# Patient Record
Sex: Female | Born: 1980 | Race: White | Hispanic: No | Marital: Single | Smoking: Former smoker
Health system: Southern US, Community
[De-identification: ages and names within clinical notes are randomized; demographics above are authoritative.]

## PROBLEM LIST (undated history)

## (undated) ENCOUNTER — Ambulatory Visit: Admission: EM

## (undated) DIAGNOSIS — I10 Essential (primary) hypertension: Secondary | ICD-10-CM

## (undated) DIAGNOSIS — F419 Anxiety disorder, unspecified: Secondary | ICD-10-CM

## (undated) DIAGNOSIS — G43909 Migraine, unspecified, not intractable, without status migrainosus: Secondary | ICD-10-CM

## (undated) DIAGNOSIS — F32A Depression, unspecified: Secondary | ICD-10-CM

## (undated) HISTORY — PX: FRACTURE SURGERY: SHX138

---
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
## 2006-11-04 ENCOUNTER — Emergency Department: Payer: Self-pay | Admitting: Internal Medicine

## 2006-12-29 ENCOUNTER — Ambulatory Visit: Payer: Self-pay | Admitting: Podiatry

## 2007-01-12 ENCOUNTER — Ambulatory Visit: Payer: Self-pay | Admitting: Podiatry

## 2007-01-17 ENCOUNTER — Ambulatory Visit: Payer: Self-pay | Admitting: Podiatry

## 2007-06-27 ENCOUNTER — Emergency Department: Payer: Self-pay | Admitting: Emergency Medicine

## 2008-04-07 ENCOUNTER — Emergency Department: Payer: Self-pay

## 2008-07-04 ENCOUNTER — Emergency Department: Payer: Self-pay | Admitting: Unknown Physician Specialty

## 2008-10-29 ENCOUNTER — Ambulatory Visit: Payer: Self-pay | Admitting: Neurology

## 2008-10-30 ENCOUNTER — Observation Stay: Payer: Self-pay | Admitting: Obstetrics & Gynecology

## 2008-11-01 ENCOUNTER — Observation Stay: Payer: Self-pay

## 2008-11-27 ENCOUNTER — Inpatient Hospital Stay: Payer: Self-pay | Admitting: Obstetrics & Gynecology

## 2010-11-12 ENCOUNTER — Emergency Department: Payer: Self-pay | Admitting: Emergency Medicine

## 2011-07-04 ENCOUNTER — Emergency Department: Payer: Self-pay | Admitting: *Deleted

## 2012-01-28 LAB — URINALYSIS, COMPLETE
Bilirubin,UR: NEGATIVE
Glucose,UR: NEGATIVE mg/dL (ref 0–75)
Leukocyte Esterase: NEGATIVE
Nitrite: NEGATIVE
Ph: 7 (ref 4.5–8.0)
Protein: NEGATIVE
RBC,UR: NONE SEEN /HPF (ref 0–5)
Specific Gravity: 1.004 (ref 1.003–1.030)
Squamous Epithelial: <1
WBC UR: <1 /HPF (ref 0–5)

## 2012-01-28 LAB — COMPREHENSIVE METABOLIC PANEL
Albumin: 4.5 g/dL (ref 3.4–5.0)
Alkaline Phosphatase: 76 U/L (ref 50–136)
Anion Gap: 9 (ref 7–16)
BUN: 9 mg/dL (ref 7–18)
Bilirubin,Total: 0.3 mg/dL (ref 0.2–1.0)
Calcium, Total: 9.3 mg/dL (ref 8.5–10.1)
Chloride: 107 mmol/L (ref 98–107)
Co2: 24 mmol/L (ref 21–32)
Creatinine: 0.81 mg/dL (ref 0.60–1.30)
EGFR (African American): >60
EGFR (Non-African Amer.): >60
Glucose: 85 mg/dL (ref 65–99)
Osmolality: 277 (ref 275–301)
Potassium: 3.8 mmol/L (ref 3.5–5.1)
SGOT(AST): 36 U/L (ref 15–37)
SGPT (ALT): 35 U/L
Sodium: 140 mmol/L (ref 136–145)
Total Protein: 8.3 g/dL — ABNORMAL HIGH (ref 6.4–8.2)

## 2012-01-28 LAB — DRUG SCREEN, URINE

## 2012-01-28 LAB — ACETAMINOPHEN LEVEL: Acetaminophen: <2 ug/mL

## 2012-01-28 LAB — CBC
HCT: 43.9 % (ref 35.0–47.0)
HGB: 15.3 g/dL (ref 12.0–16.0)
MCH: 31.9 pg (ref 26.0–34.0)
MCHC: 34.8 g/dL (ref 32.0–36.0)
MCV: 92 fL (ref 80–100)
Platelet: 238 10*3/uL (ref 150–440)
RBC: 4.79 10*6/uL (ref 3.80–5.20)
RDW: 13.4 % (ref 11.5–14.5)
WBC: 10.3 10*3/uL (ref 3.6–11.0)

## 2012-01-28 LAB — SALICYLATE LEVEL: Salicylates, Serum: 4.3 mg/dL — ABNORMAL HIGH

## 2012-01-28 LAB — ETHANOL
Ethanol %: <0.003 % (ref 0.000–0.080)
Ethanol: <3 mg/dL

## 2012-01-28 LAB — TSH: Thyroid Stimulating Horm: 1.28 u[IU]/mL

## 2012-01-28 LAB — PREGNANCY, URINE: Pregnancy Test, Urine: NEGATIVE m[IU]/mL

## 2012-01-29 ENCOUNTER — Inpatient Hospital Stay: Payer: Self-pay | Admitting: Psychiatry

## 2012-04-20 ENCOUNTER — Emergency Department: Payer: Self-pay | Admitting: Unknown Physician Specialty

## 2012-04-20 LAB — RAPID INFLUENZA A&B ANTIGENS

## 2012-04-22 LAB — BETA STREP CULTURE(ARMC)

## 2012-09-28 ENCOUNTER — Emergency Department: Payer: Self-pay | Admitting: Emergency Medicine

## 2013-11-19 ENCOUNTER — Encounter: Payer: Self-pay | Admitting: General Surgery

## 2013-12-07 ENCOUNTER — Emergency Department: Payer: Self-pay | Admitting: Emergency Medicine

## 2013-12-09 ENCOUNTER — Ambulatory Visit: Payer: Self-pay | Admitting: General Surgery

## 2013-12-10 ENCOUNTER — Encounter: Payer: Self-pay | Admitting: *Deleted

## 2014-02-18 ENCOUNTER — Inpatient Hospital Stay: Payer: Self-pay | Admitting: Obstetrics & Gynecology

## 2014-02-18 LAB — CBC WITH DIFFERENTIAL/PLATELET
BASOS PCT: 0 %
Basophil #: 0 10*3/uL (ref 0.0–0.1)
Basophil #: 0 10*3/uL (ref 0.0–0.1)
Basophil #: 0 10*3/uL (ref 0.0–0.1)
Basophil #: 0 10*3/uL (ref 0.0–0.1)
Basophil %: 0.1 %
Basophil %: 0 %
Basophil %: 0.1 %
EOS ABS: 0.1 10*3/uL (ref 0.0–0.7)
EOS ABS: 0 10*3/uL (ref 0.0–0.7)
EOS PCT: 0.6 %
EOS PCT: 0.1 %
Eosinophil #: 0 10*3/uL (ref 0.0–0.7)
Eosinophil #: 0 10*3/uL (ref 0.0–0.7)
Eosinophil %: 0.1 %
Eosinophil %: 0 %
HCT: 29.3 % — ABNORMAL LOW (ref 35.0–47.0)
HCT: 22.3 % — AB (ref 35.0–47.0)
HCT: 20.5 % — ABNORMAL LOW (ref 35.0–47.0)
HCT: 24.8 % — AB (ref 35.0–47.0)
HGB: 9.8 g/dL — AB (ref 12.0–16.0)
HGB: 7.2 g/dL — AB (ref 12.0–16.0)
HGB: 6.8 g/dL — AB (ref 12.0–16.0)
HGB: 8.5 g/dL — ABNORMAL LOW (ref 12.0–16.0)
LYMPHS PCT: 8.1 %
Lymphocyte #: 2.9 10*3/uL (ref 1.0–3.6)
Lymphocyte #: 1.1 10*3/uL (ref 1.0–3.6)
Lymphocyte #: 1 10*3/uL (ref 1.0–3.6)
Lymphocyte #: 1.8 10*3/uL (ref 1.0–3.6)
Lymphocyte %: 26.1 %
Lymphocyte %: 7.8 %
Lymphocyte %: 16.9 %
MCH: 30.9 pg (ref 26.0–34.0)
MCH: 29.4 pg (ref 26.0–34.0)
MCH: 30 pg (ref 26.0–34.0)
MCH: 30.6 pg (ref 26.0–34.0)
MCHC: 33.6 g/dL (ref 32.0–36.0)
MCHC: 32.1 g/dL (ref 32.0–36.0)
MCHC: 33.3 g/dL (ref 32.0–36.0)
MCHC: 34.5 g/dL (ref 32.0–36.0)
MCV: 92 fL (ref 80–100)
MCV: 92 fL (ref 80–100)
MCV: 90 fL (ref 80–100)
MCV: 89 fL (ref 80–100)
MONO ABS: 0.5 x10 3/mm (ref 0.2–0.9)
MONO ABS: 0.6 x10 3/mm (ref 0.2–0.9)
MONOS PCT: 3.8 %
Monocyte #: 0.6 x10 3/mm (ref 0.2–0.9)
Monocyte #: 0.8 x10 3/mm (ref 0.2–0.9)
Monocyte %: 5.1 %
Monocyte %: 5.3 %
Monocyte %: 5.4 %
NEUTROS ABS: 7.5 10*3/uL — AB (ref 1.4–6.5)
NEUTROS PCT: 88.1 %
Neutrophil #: 12.6 10*3/uL — ABNORMAL HIGH (ref 1.4–6.5)
Neutrophil #: 11.3 10*3/uL — ABNORMAL HIGH (ref 1.4–6.5)
Neutrophil #: 8.4 10*3/uL — ABNORMAL HIGH (ref 1.4–6.5)
Neutrophil %: 68.1 %
Neutrophil %: 86.8 %
Neutrophil %: 77.5 %
PLATELETS: 118 10*3/uL — AB (ref 150–440)
Platelet: 134 10*3/uL — ABNORMAL LOW (ref 150–440)
Platelet: 121 10*3/uL — ABNORMAL LOW (ref 150–440)
Platelet: 119 10*3/uL — ABNORMAL LOW (ref 150–440)
RBC: 3.18 10*6/uL — ABNORMAL LOW (ref 3.80–5.20)
RBC: 2.43 10*6/uL — ABNORMAL LOW (ref 3.80–5.20)
RBC: 2.27 10*6/uL — ABNORMAL LOW (ref 3.80–5.20)
RBC: 2.79 10*6/uL — ABNORMAL LOW (ref 3.80–5.20)
RDW: 14.1 % (ref 11.5–14.5)
RDW: 13.9 % (ref 11.5–14.5)
RDW: 13.9 % (ref 11.5–14.5)
RDW: 14 % (ref 11.5–14.5)
WBC: 11 10*3/uL (ref 3.6–11.0)
WBC: 14.5 10*3/uL — ABNORMAL HIGH (ref 3.6–11.0)
WBC: 12.8 10*3/uL — ABNORMAL HIGH (ref 3.6–11.0)
WBC: 10.8 10*3/uL (ref 3.6–11.0)

## 2014-02-18 LAB — COMPREHENSIVE METABOLIC PANEL
ALT: 24 U/L
Albumin: 2.3 g/dL — ABNORMAL LOW (ref 3.4–5.0)
Alkaline Phosphatase: 98 U/L
Anion Gap: 8 (ref 7–16)
BILIRUBIN TOTAL: 0.2 mg/dL (ref 0.2–1.0)
BUN: 6 mg/dL — ABNORMAL LOW (ref 7–18)
CALCIUM: 8.2 mg/dL — AB (ref 8.5–10.1)
CO2: 19 mmol/L — AB (ref 21–32)
CREATININE: 0.82 mg/dL (ref 0.60–1.30)
Chloride: 111 mmol/L — ABNORMAL HIGH (ref 98–107)
EGFR (African American): >60
EGFR (Non-African Amer.): >60
Glucose: 115 mg/dL — ABNORMAL HIGH (ref 65–99)
Osmolality: 274 (ref 275–301)
Potassium: 3.7 mmol/L (ref 3.5–5.1)
SGOT(AST): 22 U/L (ref 15–37)
SODIUM: 138 mmol/L (ref 136–145)
Total Protein: 5.9 g/dL — ABNORMAL LOW (ref 6.4–8.2)

## 2014-02-18 LAB — URINALYSIS, COMPLETE
BACTERIA: NONE SEEN
BILIRUBIN, UR: NEGATIVE
Blood: NEGATIVE
GLUCOSE, UR: NEGATIVE mg/dL (ref 0–75)
Hyaline Cast: 2
Ketone: NEGATIVE
LEUKOCYTE ESTERASE: NEGATIVE
NITRITE: NEGATIVE
Ph: 7 (ref 4.5–8.0)
Protein: 100
RBC,UR: NONE SEEN /HPF (ref 0–5)
SPECIFIC GRAVITY: 1.011 (ref 1.003–1.030)
Squamous Epithelial: 1
WBC UR: 3 /HPF (ref 0–5)

## 2014-02-18 LAB — PROTIME-INR
INR: 1
INR: 1.2
INR: 1
PROTHROMBIN TIME: 13.1 s (ref 11.5–14.7)
Prothrombin Time: 15 secs — ABNORMAL HIGH (ref 11.5–14.7)
Prothrombin Time: 13.2 secs (ref 11.5–14.7)

## 2014-02-18 LAB — APTT
Activated PTT: 27.8 secs (ref 23.6–35.9)
Activated PTT: 31 secs (ref 23.6–35.9)
Activated PTT: 30.6 secs (ref 23.6–35.9)
Activated PTT: 29.7 secs (ref 23.6–35.9)

## 2014-02-18 LAB — FIBRINOGEN
FIBRINOGEN: 170 mg/dL — AB (ref 210–470)
FIBRINOGEN: 175 mg/dL — AB (ref 210–470)
Fibrinogen: 236 mg/dL (ref 210–470)

## 2014-02-18 LAB — DRUG SCREEN, URINE
AMPHETAMINES, UR SCREEN: NEGATIVE (ref ?–1000)
BARBITURATES, UR SCREEN: NEGATIVE (ref ?–200)
Benzodiazepine, Ur Scrn: NEGATIVE (ref ?–200)
Cannabinoid 50 Ng, Ur ~~LOC~~: NEGATIVE (ref ?–50)
Cocaine Metabolite,Ur ~~LOC~~: NEGATIVE (ref ?–300)
MDMA (Ecstasy)Ur Screen: NEGATIVE (ref ?–500)
Methadone, Ur Screen: NEGATIVE (ref ?–300)
Opiate, Ur Screen: POSITIVE (ref ?–300)
Phencyclidine (PCP) Ur S: NEGATIVE (ref ?–25)
Tricyclic, Ur Screen: NEGATIVE (ref ?–1000)

## 2014-02-19 LAB — CBC WITH DIFFERENTIAL/PLATELET
BASOS ABS: 0 10*3/uL (ref 0.0–0.1)
Basophil #: 0 10*3/uL (ref 0.0–0.1)
Basophil %: 0.1 %
Basophil %: 0.1 %
Eosinophil #: 0 10*3/uL (ref 0.0–0.7)
Eosinophil #: 0 10*3/uL (ref 0.0–0.7)
Eosinophil %: 0 %
Eosinophil %: 0.4 %
HCT: 20.9 % — ABNORMAL LOW (ref 35.0–47.0)
HCT: 22.1 % — ABNORMAL LOW (ref 35.0–47.0)
HGB: 7 g/dL — AB (ref 12.0–16.0)
HGB: 7.6 g/dL — ABNORMAL LOW (ref 12.0–16.0)
LYMPHS ABS: 2 10*3/uL (ref 1.0–3.6)
Lymphocyte #: 0.9 10*3/uL — ABNORMAL LOW (ref 1.0–3.6)
Lymphocyte %: 6.9 %
Lymphocyte %: 17.6 %
MCH: 29.5 pg (ref 26.0–34.0)
MCH: 30.2 pg (ref 26.0–34.0)
MCHC: 33.5 g/dL (ref 32.0–36.0)
MCHC: 34.6 g/dL (ref 32.0–36.0)
MCV: 88 fL (ref 80–100)
MCV: 87 fL (ref 80–100)
MONOS PCT: 6.5 %
Monocyte #: 0.5 x10 3/mm (ref 0.2–0.9)
Monocyte #: 0.7 x10 3/mm (ref 0.2–0.9)
Monocyte %: 4.3 %
NEUTROS ABS: 8.4 10*3/uL — AB (ref 1.4–6.5)
NEUTROS PCT: 88.7 %
NEUTROS PCT: 75.4 %
Neutrophil #: 11.2 10*3/uL — ABNORMAL HIGH (ref 1.4–6.5)
Platelet: 110 10*3/uL — ABNORMAL LOW (ref 150–440)
Platelet: 108 10*3/uL — ABNORMAL LOW (ref 150–440)
RBC: 2.38 10*6/uL — AB (ref 3.80–5.20)
RBC: 2.52 10*6/uL — AB (ref 3.80–5.20)
RDW: 14 % (ref 11.5–14.5)
RDW: 14.5 % (ref 11.5–14.5)
WBC: 12.7 10*3/uL — ABNORMAL HIGH (ref 3.6–11.0)
WBC: 11.1 10*3/uL — AB (ref 3.6–11.0)

## 2014-02-20 LAB — HEMATOCRIT: HCT: 20.5 % — AB (ref 35.0–47.0)

## 2014-02-21 LAB — PATHOLOGY REPORT

## 2014-09-05 ENCOUNTER — Ambulatory Visit: Payer: Self-pay | Admitting: Registered Nurse

## 2014-09-25 ENCOUNTER — Ambulatory Visit: Payer: Self-pay | Admitting: Emergency Medicine

## 2014-09-25 LAB — RAPID STREP-A WITH REFLX: Micro Text Report: NEGATIVE

## 2014-09-27 ENCOUNTER — Ambulatory Visit: Payer: Self-pay | Admitting: Physician Assistant

## 2014-09-28 LAB — BETA STREP CULTURE(ARMC)
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
## 2014-10-21 NOTE — Discharge Summary (Signed)
PATIENT NAME:  [name], [name] DATE OF BIRTH:  September 08, 1980  DATE OF ADMISSION:  01/29/2012 DATE OF DISCHARGE:  01/30/2012  HOSPITAL COURSE: See dictated history and physical for details of admission. This 34 year old woman with no previous psychiatric hospitalization was brought to the Emergency Room by EMS after she impulsively tried to overdose at home. Since being in the hospital the patient has denied any suicidal ideation. She has described how she has long-term anxiety and irritability especially with regard to her husband. She and her husband were in an argument when she impulsively went to swallow some pills right in front of him. She says at that time she was sure that he was going to stop her which he did. She admits that she probably has some problems with mood swings at times. She was agreeable to starting medication for depression and anxiety. First day here she got venlafaxine 75 mg a day which I subsequently changed to Prozac. She tells me now that she prefers the way that the venlafaxine felt and would like to continue with that. I have changed her back to venlafaxine extended-release 75 mg a day. Patient has been involved in groups and individual therapy and educated about the importance of getting therapy and staying on medication hopefully to try to decrease long-term risk of dangerousness and help her with her anxiety and depression. She is agreeable to a follow up plan. At this point her husband is agreeable to her coming back home. She appears to be at baseline. She will be discharged back home with [facility].   LABORATORY, DIAGNOSTIC AND RADIOLOGICAL DATA: Admission labs showed a pregnancy test that was negative. Urinalysis unremarkable. Drug screen negative. TSH normal. CBC normal. Alcohol undetectable. Chemistry show slightly elevated total protein of 8.3 of no significance.   DISCHARGE MEDICATIONS:  1. Venlafaxine extended-release 75 mg one per  day. 2. Trazodone 50 mg at night.   MENTAL STATUS EXAM:: Neatly dressed and groomed woman who looks her stated age. Good eye contact. Normal psychomotor activity. Appropriate interaction style. Alert and oriented x4. Short-term memory and longer term memory intact. Appears to be of normal intelligence. Affect reactive, euthymic and appropriate. Mood stated as being good. Thoughts are lucid with no evidence of loosening of associations or delusions. Denied auditory or visual hallucinations. Denied suicidal or homicidal ideation. Judgment and insight improved.   DISPOSITION: Discharged home. [facility].   DIAGNOSIS PRINCIPLE AND PRIMARY:  AXIS I: Anxiety disorder, not otherwise specified.   SECONDARY DIAGNOSES:  AXIS I: No further.   AXIS II: Deferred.   AXIS III: No diagnosis.   AXIS IV: Moderate from stress in her relationship with her husband.   AXIS V: Functioning at time of discharge 60.   ____________________________ Audery AmelJohn T. Zyana Amaro, MD jtc:cms D: 01/30/2012 11:51:08 ET T: 01/31/2012 10:22:14 ET JOB#: [id number]  cc: Audery AmelJohn T. Aryan Sparks, MD, <Dictator> Audery AmelJOHN T Lovey Crupi MD ELECTRONICALLY SIGNED 01/31/2012 11:16
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
## 2014-11-11 NOTE — H&P (Signed)
L&D Evaluation:  History Expanded:  HPI 34yo G2P1 at 14 weeks w estimated date of confinement 03/03/14, who presents to ER after syncopal event at work with fall.  She remembers waking up, and witnesses said they heard a loud noise and came to her aid immediately, finding her face down on ground.  No known trauma from fall to head or long bones.  Found to be Hypotensive by EMS and ER initially.  Fetal doppler found no FHTs, and Ultrasound was done in ER with also no FHTs.  Confirmed by CNM.  Confirmed by self.  Patient denies vag bleeding or contractions or ROM.    Patient reports h/o syncope since age 57, with several events over the years, never found to have any other specific etiology, according to the patient and her mother.  No h/o seizure d/o.  Patient has not had an episode this pregnancy before today.  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  Prenatal Care at [facility] by obesity and prior Cesarean Section, planning on Cesarean Section and Bilateral Tubal Ligation on Aug 27.  No HTN, GDM, Preterm Labor, infections, trauma, or other prenatal concerns.  O+, RNI, VI, GBS-, TDaP UTD.   Gravida 2   Term 1   Blood Type (Maternal) O positive   Group B Strep Results Maternal (Result >5wks must be treated as unknown) negative   Maternal Varicella Immune   Rubella Results (Maternal) nonimmune   Maternal T-Dap Immune   [facility] 03-Mar-2014   Patient's Medical History Obesity   Patient's Surgical History Previous C-Section   Medications Pre Natal Vitamins   Allergies NKDA   Social History none   Family History Non-Contributory   ROS:  ROS All systems were reviewed.  HEENT, CNS, GI, GU, Respiratory, CV, Renal and Musculoskeletal systems were found to be normal.   Exam:  Vital Signs BP 80/40, up to 100/60, P90   General no apparent distress   Mental Status clear   Chest clear   Heart normal sinus rhythm, no murmur/gallop/rubs   Abdomen gravid,  non-tender, Vtx   Estimated Fetal Weight Average for gestational age   Back no CVAT   Edema no edema   FHT No FHTs   Skin dry   Impression:  Impression Fetal demise, related to syncope and/or trauma.  May have abruption.   Plan:  Plan Fetal demise discused w patient.   Comments 1. Evaluation as to why demise occured.  Most likely she had syncope w fall, and during time of hypotension there was poor circulation to fetus.  There also could be placental dysfunction such as abruption, before the fall that led to the hypotension, or after the fall from the trauma.  Other causes can be considered, but lower liklihood and with this more clear event, it seems to be related to syncope/fall. 2. Ultrasound by [facility], visisualized with Midwife Danise Mina, and then repeated briefly by self for confirmation, both confirm abscence of fetal cardiac activity.  Unable to clearly tell abruption from these scans.  FLuid levels appear low.  Vertex.  No FM. 3. Induction of labor discussed w patient, even as she is prior Cesarean Section.  Most benefit of Cesarean Section is for fetal safety, and so little benefit for Cesarean Section at this time.  Monitor labor induction for any signs/risks of uterine rupture.  Would prefer Pitocin to prostaglandins for this reason.  Will prepare opt for labor, with any analgesia requried, once clear from ER from hypotension  and trauma (head injury?) standpoint. 4. O+ 5. Needs MMR pp. 6. Chaplain avaiable.  Counseling as needed. Sorrow expressed to family.   Electronic Signatures: Hoyt Koch (MD)  (Signed 18-Aug-15 06:16)  Authored: L&D Evaluation   Last Updated: 18-Aug-15 06:16 by Hoyt Koch (MD)

## 2015-08-26 ENCOUNTER — Ambulatory Visit
Admission: EM | Admit: 2015-08-26 | Discharge: 2015-08-26 | Disposition: A | Discharge status: Other Acute Inpatient Hospital | Payer: 59 | Attending: Family Medicine | Admitting: Family Medicine

## 2015-08-26 ENCOUNTER — Encounter: Payer: Self-pay | Admitting: *Deleted

## 2015-08-26 DIAGNOSIS — R1011 Right upper quadrant pain: Secondary | ICD-10-CM

## 2015-08-26 DIAGNOSIS — R197 Diarrhea, unspecified: Secondary | ICD-10-CM

## 2015-08-26 DIAGNOSIS — R1110 Vomiting, unspecified: Secondary | ICD-10-CM

## 2015-08-26 LAB — RAPID INFLUENZA A&B ANTIGENS
Influenza A (ARMC): NOT DETECTED
Influenza B (ARMC): NOT DETECTED

## 2015-08-26 LAB — OCCULT BLOOD X 1 CARD TO LAB, STOOL: FECAL OCCULT BLD: NEGATIVE

## 2015-08-26 MED ORDER — ONDANSETRON 4 MG PO TBDP
4.0000 mg | ORAL_TABLET | Freq: Once | ORAL | Status: AC
  Administered 2015-08-26: 4 mg via ORAL

## 2015-08-26 NOTE — ED Notes (Signed)
Cough, fever, epigastric pain, diarrhea, dark stools since yesterday.

## 2015-08-26 NOTE — ED Provider Notes (Signed)
[facility]  ____________________________________________  Time seen: Approximately 6:36 PM  I have reviewed the triage vital signs and the nursing notes.   HISTORY  Chief Complaint Cough; Fever; Abdominal Pain; and Diarrhea   HPI [name] is a 35 y.o. female presents with a complaint of one day history of nausea, vomiting, diarrhea and abdominal pain. Patient reports that at work last night she had a 102.6 orally fever. Reports decreased appetite. Reports continues to drink fluids. Denies current nausea. Patient reports that she has abdominal pain described as 7 out of 10 to abdomen generalized and described as a "twisting pain ", and reports that the abdominal pain worsens after food as well as prior to needing to vomit or diarrhea. States this time the abdominal pain is mild. Patient reports that she has numerous diarrhea episodes today but only with a few episodes of vomiting. Patient reports that prior to arrival she had episode of diarrhea in which her stool was very dark colored. Denies blood in stool, blood in toilet or blood when wiping. Denies hemorrhoids. Denies history of rectal bleeding. States current abdominal pain is mild.  Denies cough, congestion, runny nose, sore throat, headache. Denies chest pain, shortness of breath, dizziness, weakness, neck or back pain. Denies rash. Denies dysuria or vaginal complaints.  Last menstrual: 2 weeks ago. Denies chance of pregnancy.   PCP: [facility]   History reviewed. No pertinent past medical history.  There are no active problems to display for this patient.   Past Surgical History  Procedure Laterality Date  . Cesarean section      No current outpatient prescriptions on file.  Allergies Review of patient's allergies indicates no known allergies.  History reviewed. No pertinent family history.  Social History Social History  Substance Use Topics  . Smoking status: Current Every Day Smoker -- 0.50  packs/day    Types: Cigarettes  . Smokeless tobacco: None  . Alcohol Use: No    Review of Systems Constitutional: Positive report of fever.  Eyes: No visual changes. ENT: No sore throat. Cardiovascular: Denies chest pain. Respiratory: Denies shortness of breath. Gastrointestinal:Positive abdominal pain, nausea, vomiting and diarrhea. Reports dark stools.  Genitourinary: Negative for dysuria. Musculoskeletal: Negative for back pain. Skin: Negative for rash. Neurological: Negative for headaches, focal weakness or numbness.  10-point ROS otherwise negative.  ____________________________________________   PHYSICAL EXAM:  VITAL SIGNS: ED Triage Vitals  Enc Vitals Group     BP 08/26/15 1738 117/72 mmHg     Pulse Rate 08/26/15 1738 96     Resp 08/26/15 1738 16     Temp 08/26/15 1738 98.9 F (37.2 C)     Temp src --  oral     SpO2 08/26/15 1738 97 %     Weight 08/26/15 1738 234 lb (106.142 kg)     Height 08/26/15 1738  (1.6 m)     Head Cir --      Peak Flow --      Pain Score 08/26/15 1743 7     Pain Loc --      Pain Edu? --      Excl. in GC? --     Constitutional: Alert and oriented. Well appearing and in no acute distress. Eyes: Conjunctivae are normal. PERRL. EOMI. Head: Atraumatic.Nontender. No swelling. No erythema.  Ears: no erythema, normal TMs bilaterally.   Nose: No congestion/rhinnorhea.  Mouth/Throat: Mucous membranes are moist.  Oropharynx non-erythematous. Neck: No stridor.  No cervical spine tenderness to palpation.  Hematological/Lymphatic/Immunilogical: No cervical lymphadenopathy. Cardiovascular: Normal rate, regular rhythm. Grossly normal heart sounds.  Good peripheral circulation. Respiratory: Normal respiratory effort.  No retractions. Lungs CTAB. Gastrointestinal:Mild bilateral lower quadrant tenderness, mild to moderate left upper quadrant tenderness to palpation, moderate tenderness to palpation epigastric and right upper quadrant with  guarding right upper quadrant . Obese abdomen  Normal Bowel sounds. No CVA tenderness. Rectal exam : Completed with RN at bedside.  No external hemorrhoids, mass or lesions. Rectal exam nontender. Stool slightly dark in color. No gross blood present. Hemoccult test obtained.  Musculoskeletal: No lower or upper extremity tenderness nor edema. No cervical, thoracic or lumbar tenderness to palpation.  Neurologic:  Normal speech and language. No gross focal neurologic deficits are appreciated. No gait instability. Skin:  Skin is warm, dry and intact. No rash noted. Psychiatric: Mood and affect are normal. Speech and behavior are normal.  ____________________________________________   LABS (all labs ordered are listed, but only abnormal results are displayed)  Labs Reviewed  RAPID INFLUENZA A&B ANTIGENS ([facility] ONLY)  OCCULT BLOOD X 1 CARD TO LAB, STOOL    INITIAL IMPRESSION / ASSESSMENT AND PLAN / ED COURSE  Pertinent labs & imaging results that were available during my care of the patient were reviewed by me and considered in my medical decision making (see chart for details).  Overall well-appearing patient. Guarding right abdomen. Presents for the complaints of nausea, vomiting, diarrhea since yesterday with accompanying fever. Reports fever of 102.6 last night. Reports tolerating some fluids but no foods today. Also reports dark stools today. Generalized diffuse abdominal tenderness with moderate tenderness right upper quadrant. Zofran 4 mg ODT 1 in urgent care.  Patient also reports that she has had GI viruses in the past and has never had abdominal pain to this degree. Patient states that she is concerned due to amount of abdominal pain that she is having. Influenza negative. Hemoccult negative. Suspect dark colored stool may be secondary to the Pepto-Bismol that she took home. From subjective report suspect viral nausea, vomiting, diarrhea however due to amount of pain concern for other  etiology.   Discussed in detail with patient evaluating by laboratory studies in urgent care. However due to concern that patient may need ultrasound right upper quadrant of abdomen for further evaluation of right upper quadrant abdominal pain and recommend patient to be further evaluated in emergency room of her choice.. After discussing with patient patient states that she does not want lab studies done in urgent care at this time. Patient states that she prefers for laboratory studies to be completed at emergency room. Patient states that she will go to [facility] emergency room. Patient states that she will drive herself and does not want to go by EMS. Patient verbalized understanding of risks of driving herself even up to death, and states will drive herself.  Kim RN to call [facility] to give report. Patient stable at the time of transfer.  Discussed follow up with Primary care physician this week. Discussed follow up and return parameters including no resolution or any worsening concerns. Patient verbalized understanding and agreed to plan.   ____________________________________________   FINAL CLINICAL IMPRESSION(S) / ED DIAGNOSES  Final diagnoses:  Right upper quadrant pain  Vomiting and diarrhea      Note: This dictation was prepared with Dragon dictation along with smaller phrase technology. Any transcriptional errors that result from this process are unintentional.    Renford Dills, NP 08/26/15 2020

## 2015-08-26 NOTE — Discharge Instructions (Signed)
As discussed go directly to the emergency room  Follow-up closely with your primary care physician this week. Return to urgent care as needed.  Abdominal Pain, Adult Many things can cause abdominal pain. Usually, abdominal pain is not caused by a disease and will improve without treatment. It can often be observed and treated at home. Your health care provider will do a physical exam and possibly order blood tests and X-rays to help determine the seriousness of your pain. However, in many cases, more time must pass before a clear cause of the pain can be found. Before that point, your health care provider may not know if you need more testing or further treatment. HOME CARE INSTRUCTIONS Monitor your abdominal pain for any changes. The following actions may help to alleviate any discomfort you are experiencing:  Only take over-the-counter or prescription medicines as directed by your health care provider.  Do not take laxatives unless directed to do so by your health care provider.  Try a clear liquid diet (broth, tea, or water) as directed by your health care provider. Slowly move to a bland diet as tolerated. SEEK MEDICAL CARE IF:  You have unexplained abdominal pain.  You have abdominal pain associated with nausea or diarrhea.  You have pain when you urinate or have a bowel movement.  You experience abdominal pain that wakes you in the night.  You have abdominal pain that is worsened or improved by eating food.  You have abdominal pain that is worsened with eating fatty foods.  You have a fever. SEEK IMMEDIATE MEDICAL CARE IF:  Your pain does not go away within 2 hours.  You keep throwing up (vomiting).  Your pain is felt only in portions of the abdomen, such as the right side or the left lower portion of the abdomen.  You pass bloody or black tarry stools. MAKE SURE YOU:  Understand these instructions.  Will watch your condition.  Will get help right away if you are not  doing well or get worse.   This information is not intended to replace advice given to you by your health care provider. Make sure you discuss any questions you have with your health care provider.   Document Released: 03/30/2005 Document Revised: 03/11/2015 Document Reviewed: 02/27/2013 Elsevier Interactive Patient Education 2016 Elsevier Inc[employer]  Nausea and Vomiting Nausea is a sick feeling that often comes before throwing up (vomiting). Vomiting is a reflex where stomach contents come out of your mouth. Vomiting can cause severe loss of body fluids (dehydration). Children and elderly adults can become dehydrated quickly, especially if they also have diarrhea. Nausea and vomiting are symptoms of a condition or disease. It is important to find the cause of your symptoms. CAUSES   Direct irritation of the stomach lining. This irritation can result from increased acid production (gastroesophageal reflux disease), infection, food poisoning, taking certain medicines (such as nonsteroidal anti-inflammatory drugs), alcohol use, or tobacco use.  Signals from the brain.These signals could be caused by a headache, heat exposure, an inner ear disturbance, increased pressure in the brain from injury, infection, a tumor, or a concussion, pain, emotional stimulus, or metabolic problems.  An obstruction in the gastrointestinal tract (bowel obstruction).  Illnesses such as diabetes, hepatitis, gallbladder problems, appendicitis, kidney problems, cancer, sepsis, atypical symptoms of a heart attack, or eating disorders.  Medical treatments such as chemotherapy and radiation.  Receiving medicine that makes you sleep (general anesthetic) during surgery. DIAGNOSIS Your caregiver may ask for tests to be done  if the problems do not improve after a few days. Tests may also be done if symptoms are severe or if the reason for the nausea and vomiting is not clear. Tests may include:  Urine tests.  Blood  tests.  Stool tests.  Cultures (to look for evidence of infection).  X-rays or other imaging studies. Test results can help your caregiver make decisions about treatment or the need for additional tests. TREATMENT You need to stay well hydrated. Drink frequently but in small amounts.You may wish to drink water, sports drinks, clear broth, or eat frozen ice pops or gelatin dessert to help stay hydrated.When you eat, eating slowly may help prevent nausea.There are also some antinausea medicines that may help prevent nausea. HOME CARE INSTRUCTIONS   Take all medicine as directed by your caregiver.  If you do not have an appetite, do not force yourself to eat. However, you must continue to drink fluids.  If you have an appetite, eat a normal diet unless your caregiver tells you differently.  Eat a variety of complex carbohydrates (rice, wheat, potatoes, bread), lean meats, yogurt, fruits, and vegetables.  Avoid high-fat foods because they are more difficult to digest.  Drink enough water and fluids to keep your urine clear or pale yellow.  If you are dehydrated, ask your caregiver for specific rehydration instructions. Signs of dehydration may include:  Severe thirst.  Dry lips and mouth.  Dizziness.  Dark urine.  Decreasing urine frequency and amount.  Confusion.  Rapid breathing or pulse. SEEK IMMEDIATE MEDICAL CARE IF:   You have blood or brown flecks (like coffee grounds) in your vomit.  You have black or bloody stools.  You have a severe headache or stiff neck.  You are confused.  You have severe abdominal pain.  You have chest pain or trouble breathing.  You do not urinate at least once every 8 hours.  You develop cold or clammy skin.  You continue to vomit for longer than 24 to 48 hours.  You have a fever. MAKE SURE YOU:   Understand these instructions.  Will watch your condition.  Will get help right away if you are not doing well or get  worse.   This information is not intended to replace advice given to you by your health care provider. Make sure you discuss any questions you have with your health care provider.   Document Released: 06/20/2005 Document Revised: 09/12/2011 Document Reviewed: 11/17/2010 Elsevier Interactive Patient Education 2016 Elsevier Inc.  Diarrhea Diarrhea is watery poop (stool). It can make you feel weak, tired, thirsty, or give you a dry mouth (signs of dehydration). Watery poop is a sign of another problem, most often an infection. It often lasts 2-3 days. It can last longer if it is a sign of something serious. Take care of yourself as told by your doctor. HOME CARE   Drink 1 cup (8 ounces) of fluid each time you have watery poop.  Do not drink the following fluids:  Those that contain simple sugars (fructose, glucose, galactose, lactose, sucrose, maltose).  Sports drinks.  Fruit juices.  Whole milk products.  Sodas.  Drinks with caffeine (coffee, tea, soda) or alcohol.  Oral rehydration solution may be used if the doctor says it is okay. You may make your own solution. Follow this recipe:   - teaspoon table salt.   teaspoon baking soda.   teaspoon salt substitute containing potassium chloride.  1 tablespoons sugar.  1 liter (34 ounces) of water.  Avoid the following foods:  High fiber foods, such as raw fruits and vegetables.  Nuts, seeds, and whole grain breads and cereals.   Those that are sweetened with sugar alcohols (xylitol, sorbitol, mannitol).  Try eating the following foods:  Starchy foods, such as rice, toast, pasta, low-sugar cereal, oatmeal, baked potatoes, crackers, and bagels.  Bananas.  Applesauce.  Eat probiotic-rich foods, such as yogurt and milk products that are fermented.  Wash your hands well after each time you have watery poop.  Only take medicine as told by your doctor.  Take a warm bath to help lessen burning or pain from having watery  poop. GET HELP RIGHT AWAY IF:   You cannot drink fluids without throwing up (vomiting).  You keep throwing up.  You have blood in your poop, or your poop looks black and tarry.  You do not pee (urinate) in 6-8 hours, or there is only a small amount of very dark pee.  You have belly (abdominal) pain that gets worse or stays in the same spot (localizes).  You are weak, dizzy, confused, or light-headed.  You have a very bad headache.  Your watery poop gets worse or does not get better.  You have a fever or lasting symptoms for more than 2-3 days.  You have a fever and your symptoms suddenly get worse. MAKE SURE YOU:   Understand these instructions.  Will watch your condition.  Will get help right away if you are not doing well or get worse.   This information is not intended to replace advice given to you by your health care provider. Make sure you discuss any questions you have with your health care provider.   Document Released: 12/07/2007 Document Revised: 07/11/2014 Document Reviewed: 02/26/2012 Elsevier Interactive Patient Education [employer]

## 2016-02-27 ENCOUNTER — Emergency Department: Payer: 59

## 2016-02-27 ENCOUNTER — Emergency Department
Admission: EM | Admit: 2016-02-27 | Discharge: 2016-02-27 | Disposition: A | Discharge status: Home / Self Care | Payer: 59 | Attending: Emergency Medicine | Admitting: Emergency Medicine

## 2016-02-27 DIAGNOSIS — S62316A Displaced fracture of base of fifth metacarpal bone, right hand, initial encounter for closed fracture: Secondary | ICD-10-CM | POA: Diagnosis not present

## 2016-02-27 DIAGNOSIS — Y939 Activity, unspecified: Secondary | ICD-10-CM | POA: Insufficient documentation

## 2016-02-27 DIAGNOSIS — W230XXA Caught, crushed, jammed, or pinched between moving objects, initial encounter: Secondary | ICD-10-CM | POA: Diagnosis not present

## 2016-02-27 DIAGNOSIS — S6991XA Unspecified injury of right wrist, hand and finger(s), initial encounter: Secondary | ICD-10-CM | POA: Diagnosis present

## 2016-02-27 DIAGNOSIS — Y92009 Unspecified place in unspecified non-institutional (private) residence as the place of occurrence of the external cause: Secondary | ICD-10-CM | POA: Diagnosis not present

## 2016-02-27 DIAGNOSIS — F17210 Nicotine dependence, cigarettes, uncomplicated: Secondary | ICD-10-CM | POA: Insufficient documentation

## 2016-02-27 DIAGNOSIS — S62309A Unspecified fracture of unspecified metacarpal bone, initial encounter for closed fracture: Secondary | ICD-10-CM

## 2016-02-27 DIAGNOSIS — Y999 Unspecified external cause status: Secondary | ICD-10-CM | POA: Diagnosis not present

## 2016-02-27 MED ORDER — DOCUSATE SODIUM 100 MG PO CAPS: ORAL_CAPSULE | ORAL | 0 refills | Status: DC

## 2016-02-27 MED ORDER — OXYCODONE-ACETAMINOPHEN 5-325 MG PO TABS
ORAL_TABLET | ORAL | Status: AC
  Filled 2016-02-27: qty 1

## 2016-02-27 MED ORDER — OXYCODONE-ACETAMINOPHEN 5-325 MG PO TABS: 1.0000 | ORAL_TABLET | ORAL | 0 refills | Status: DC | PRN

## 2016-02-27 MED ORDER — OXYCODONE-ACETAMINOPHEN 5-325 MG PO TABS
1.0000 | ORAL_TABLET | ORAL | Status: DC | PRN
  Administered 2016-02-27: 1 via ORAL

## 2016-02-27 MED ORDER — OXYCODONE-ACETAMINOPHEN 5-325 MG PO TABS
2.0000 | ORAL_TABLET | Freq: Once | ORAL | Status: AC
  Administered 2016-02-27: 2 via ORAL
  Filled 2016-02-27: qty 2

## 2016-02-27 NOTE — ED Triage Notes (Signed)
Pt ambulatory to triage with no difficulty. Pt reports around 730 pm she shut her right hand in her front door. Pain and swelling to hand. CMS intact.

## 2016-02-27 NOTE — Discharge Instructions (Signed)
Please read through the included information about metacarpal fractures and splint care.  You need to follow up with an orthopedic surgeon such as Dr. Martha ClanKrasinski next week.  He may be able to manage nonoperatively or you may need a minor surgical procedure to help the fracture healed.  Please return to the emergency department with new or worsening symptoms that concern you.

## 2016-02-27 NOTE — ED Notes (Signed)
Pt. States she accidentally closed door on rt. Hand.  Pt. Has swelling to 4th and 5th distal metacarpals.

## 2016-02-27 NOTE — ED Provider Notes (Signed)
[facility] Emergency Department Provider Note  ____________________________________________   First MD Initiated Contact with Patient 02/27/16 [phone]     (approximate)  I have reviewed the triage vital signs and the nursing notes.   HISTORY  Chief Complaint Hand Pain    HPI [name] is a 35 y.o. female with no significant past medical history who presents for evaluation of acute onset right hand pain after shutting her hand in the front door of her house.  She states that it was an accident and that she did it to herself.  The pain was sharp and throbbing and has been present since the accident hours ago.  She tried to go to work but was not able to bear any weight with her hand.  There is a significant amount of swelling and bruising of the hand on the ulnar side.  She has pain that is reproduced especially with extension or flexion of the pinky finger.  There is no numbness or tingling and she has normal sensation.  She did not sustain any other injuries.  She describes the pain as severe and any amount of movement makes it worse.   No past medical history on file.  There are no active problems to display for this patient.   Past Surgical History:  Procedure Laterality Date  . CESAREAN SECTION      Prior to Admission medications   Medication Sig Start Date End Date Taking? Authorizing Provider  docusate sodium (COLACE) 100 MG capsule Take 1 tablet once or twice daily as needed for constipation while taking narcotic pain medicine 02/27/16   Loleta Rose, MD  oxyCODONE-acetaminophen (ROXICET) 5-325 MG tablet Take 1-2 tablets by mouth every 4 (four) hours as needed for severe pain. 02/27/16   Loleta Rose, MD    Allergies Review of patient's allergies indicates no known allergies.  No family history on file.  Social History Social History  Substance Use Topics  . Smoking status: Current Every Day Smoker    Packs/day: 0.50    Types:  Cigarettes  . Smokeless tobacco: Not on file  . Alcohol use No    Review of Systems Cardiovascular: Denies chest pain. Respiratory: Denies shortness of breath. Gastrointestinal: No abdominal pain.  No nausea, no vomiting.  No diarrhea.  No constipation. Musculoskeletal: Severe right hand pain Skin: Negative for rash and laceration Neurological: Negative for headaches, focal weakness or numbness.   ____________________________________________   PHYSICAL EXAM:  VITAL SIGNS: ED Triage Vitals  Enc Vitals Group     BP 02/27/16 0138 129/72     Pulse Rate 02/27/16 0138 (!) 104     Resp 02/27/16 0138 18     Temp 02/27/16 0138 98.9 F (37.2 C)     Temp Source 02/27/16 0138 Oral     SpO2 02/27/16 0138 100 %     Weight 02/27/16 0139 228 lb (103.4 kg)     Height 02/27/16 0139 5\' 3"  (1.6 m)     Head Circumference --      Peak Flow --      Pain Score 02/27/16 0139 9     Pain Loc --      Pain Edu? --      Excl. in GC? --     Constitutional: Alert and oriented. Well appearing and in no acute distress. Head: Atraumatic. Cardiovascular: Normal rate, regular rhythm. Good peripheral circulation.  Respiratory: Normal respiratory effort.  No retractions.  Musculoskeletal: Mild to moderate swelling and ecchymosis  of the ulnar side of the patient's hand most notable on the dorsal aspect.  Severe tenderness to palpation and with passive flexion and extension of the pinky finger but there is no evidence of injury to the digits themselves.  No snuffbox tenderness or any other pain on the radial side of the hand. Injury is closed with no lacerations. Neurologic:  Normal speech and language. No gross focal neurologic deficits are appreciated.  Skin:  Skin is warm, dry and intact. No rash noted. Psychiatric: Mood and affect are normal. Speech and behavior are normal.  ____________________________________________   LABS (all labs ordered are listed, but only abnormal results are  displayed)  Labs Reviewed - No data to display ____________________________________________  EKG  None - EKG not ordered by ED physician ____________________________________________  RADIOLOGY Marylou Mccoy, personally viewed and evaluated these images (plain radiographs) as part of my medical decision making, as well as reviewing the written report by the radiologist.   Dg Hand Complete Right  Result Date: 02/27/2016 CLINICAL DATA:  Patient slammed right hand in the front door this morning. Bruising and swelling medially. EXAM: RIGHT HAND - COMPLETE 3+ VIEW COMPARISON:  None. FINDINGS: Acute fracture of the distal right fifth metacarpal bone with volar angulation of the distal fracture fragments. Overlying soft tissue swelling is present. No dislocation at the metacarpal phalangeal joint. Right hand appears otherwise intact. IMPRESSION: Fracture of the distal right fifth metacarpal bone with volar angulation of the distal fracture fragment Electronically Signed   By: Burman Nieves M.D.   On: 02/27/2016 02:19    ____________________________________________   PROCEDURES  Procedure(s) performed:   .Splint Application Date/Time: 02/27/2016 4:00 AM Performed by: Loleta Rose Authorized by: Loleta Rose   Consent:    Consent obtained:  Verbal Pre-procedure details:    Sensation:  Normal Procedure details:    Laterality:  Right   Location:  Arm   Arm:  R lower arm   Cast type:  Short arm   Splint type:  Ulnar gutter   Supplies:  Ortho-Glass Post-procedure details:    Pain:  Unchanged   Sensation:  Normal   Patient tolerance of procedure:  Tolerated well, no immediate complications     Critical Care performed: No ____________________________________________   INITIAL IMPRESSION / ASSESSMENT AND PLAN / ED COURSE  Pertinent labs & imaging results that were available during my care of the patient were reviewed by me and considered in my medical decision making  (see chart for details).  I discussed the case with Dr. Martha Clan who agreed with my plan for ulnar gutter splint and close outpatient follow-up.  The patient tolerated the splint with no problems and has no evidence of neurovascular compromise.  I gave my usual and customary return precautions.     ____________________________________________  FINAL CLINICAL IMPRESSION(S) / ED DIAGNOSES  Final diagnoses:  Metacarpal bone fracture, closed, initial encounter     MEDICATIONS GIVEN DURING THIS VISIT:  Medications  oxyCODONE-acetaminophen (PERCOCET/ROXICET) 5-325 MG per tablet 2 tablet (2 tablets Oral Given 02/27/16 0410)     NEW OUTPATIENT MEDICATIONS STARTED DURING THIS VISIT:  New Prescriptions   DOCUSATE SODIUM (COLACE) 100 MG CAPSULE    Take 1 tablet once or twice daily as needed for constipation while taking narcotic pain medicine   OXYCODONE-ACETAMINOPHEN (ROXICET) 5-325 MG TABLET    Take 1-2 tablets by mouth every 4 (four) hours as needed for severe pain.      Note:  This document was prepared using Dragon  voice recognition software and may include unintentional dictation errors.    Loleta Roseory Maziyah Vessel, MD 02/27/16 [phone]

## 2016-05-12 IMAGING — CT CT HEAD WITHOUT CONTRAST
2 of 3 series · 16 of 30 positions shown, 20 images · non-contrast
Comparison: None.

CLINICAL DATA: Syncope

EXAM:
CT HEAD WITHOUT CONTRAST
TECHNIQUE: Contiguous axial images were obtained from the base of the skull
through the vertex without intravenous contrast.

[Series 2: head wo · axial · 0.41mm/px · z∈[-92,+25]mm · 13 of 32 slices shown, 17 images]
[im 3/32  brain]
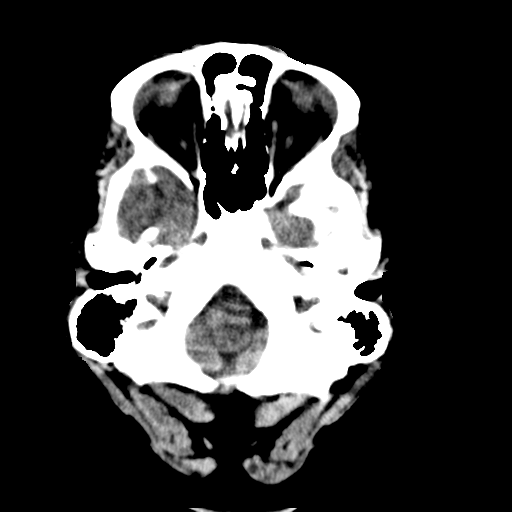
[im 3/32  bone]
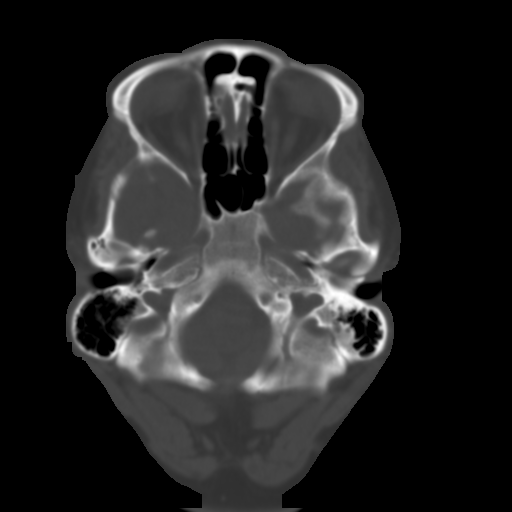
[im 5/32  brain]
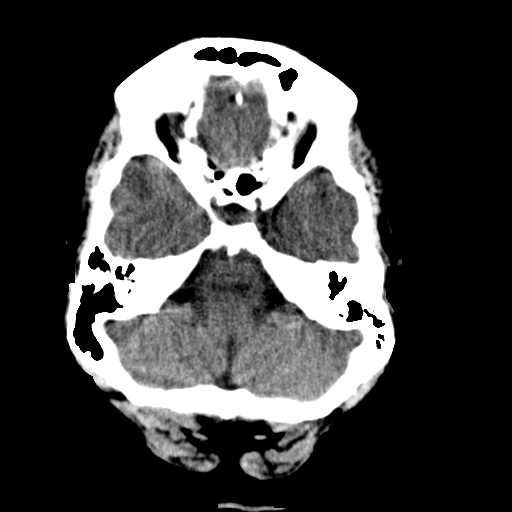
[im 7/32  brain]
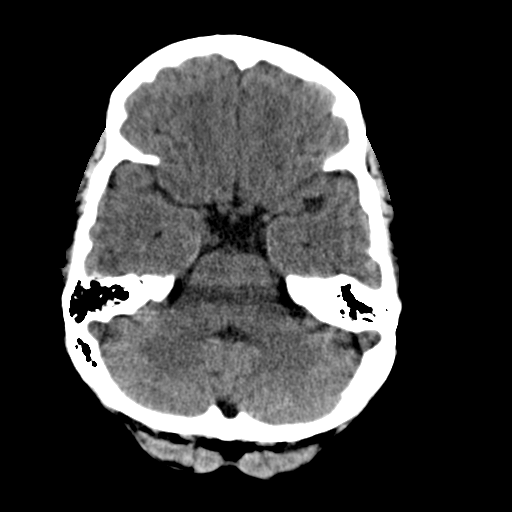
[im 9/32  brain]
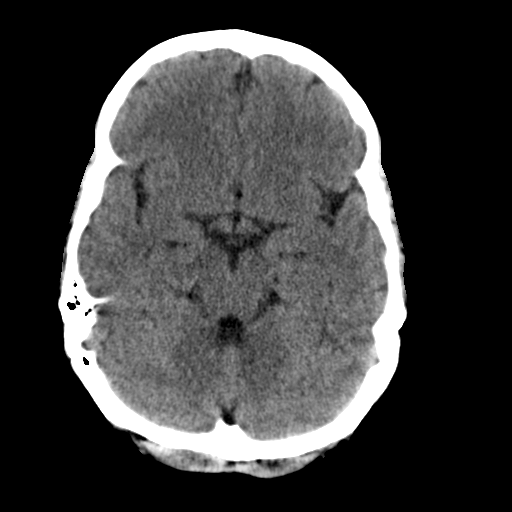
[im 12/32  brain]
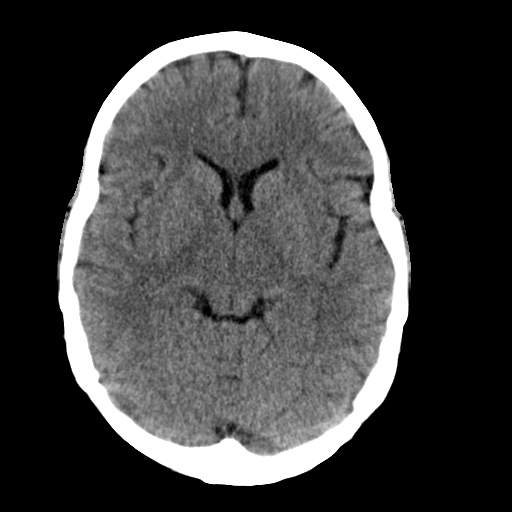
[im 12/32  bone]
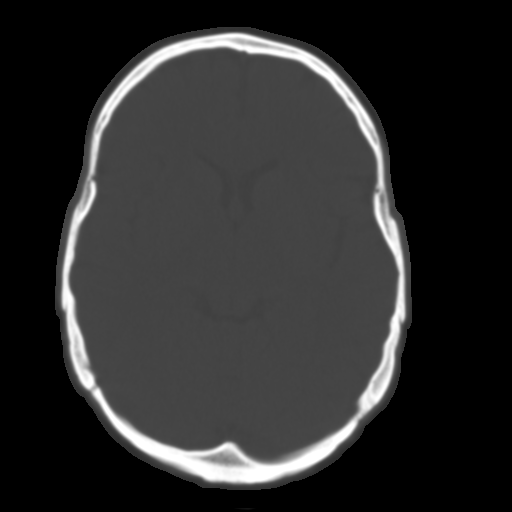
[im 14/32  brain]
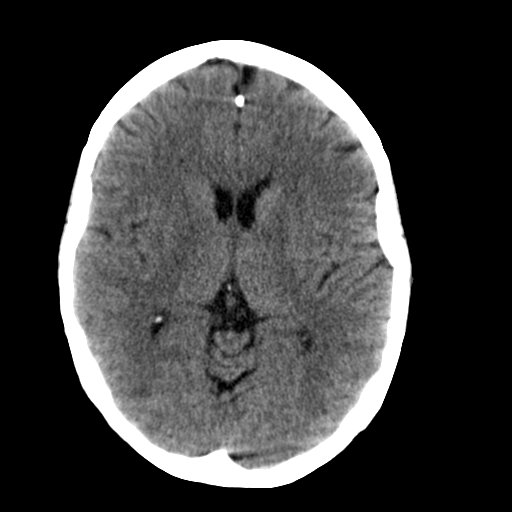
[im 16/32  brain]
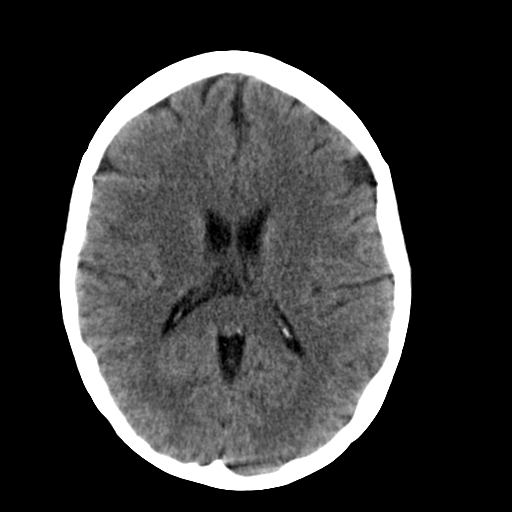
[im 18/32  brain]
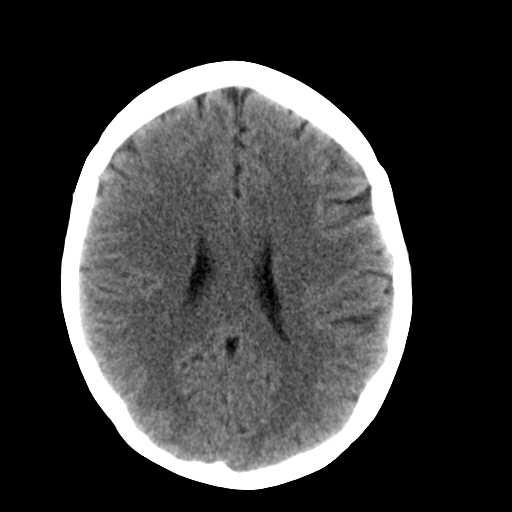
[im 20/32  brain]
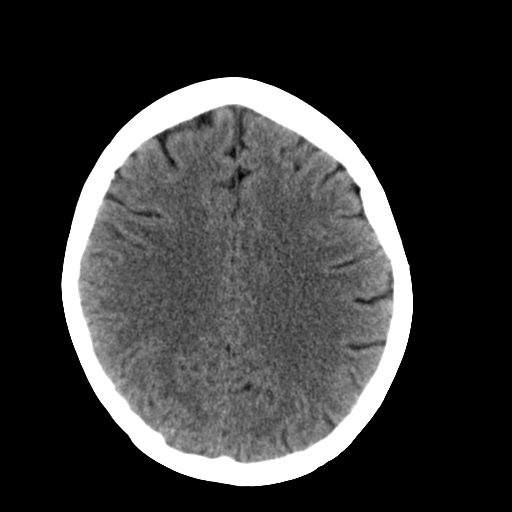
[im 20/32  bone]
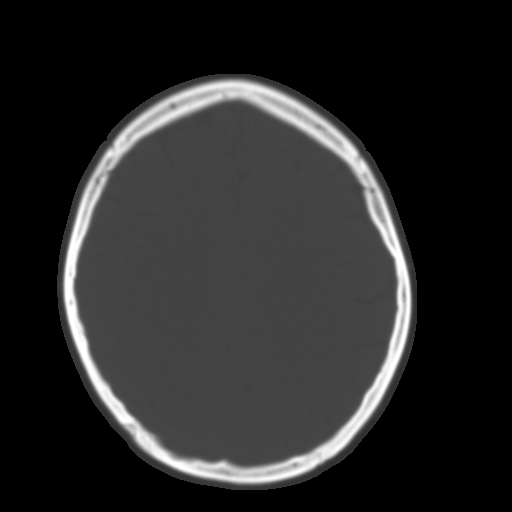
[im 23/32  brain]
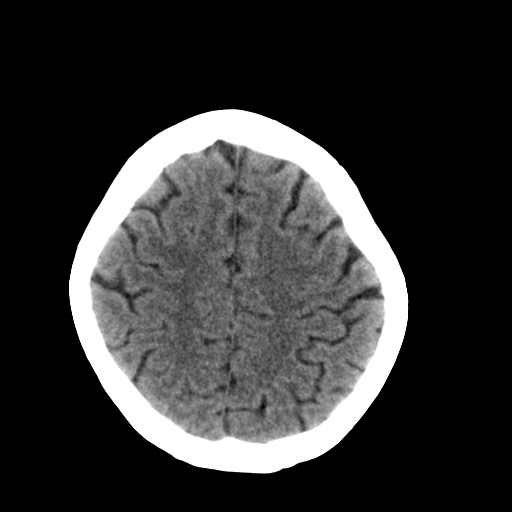
[im 25/32  brain]
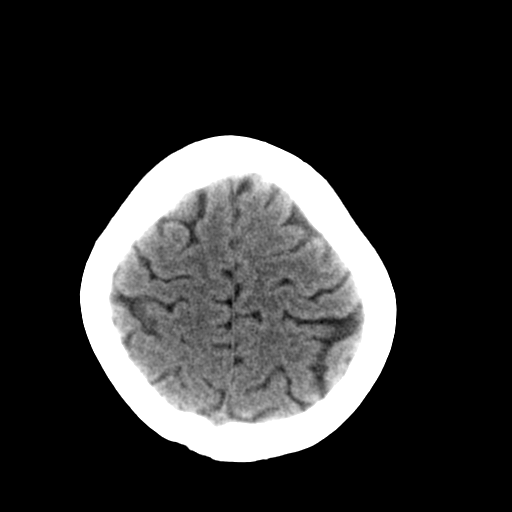
[im 27/32  brain]
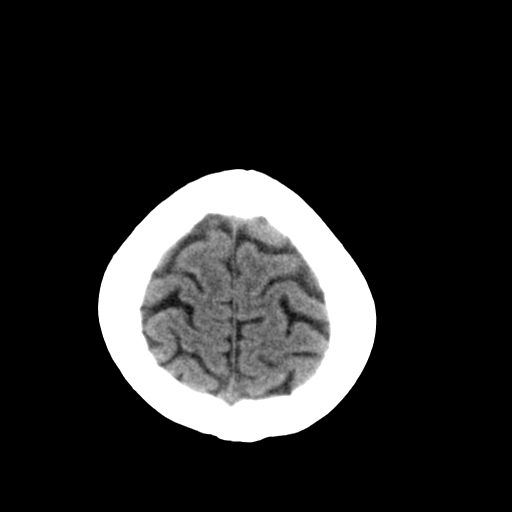
[im 29/32  brain]
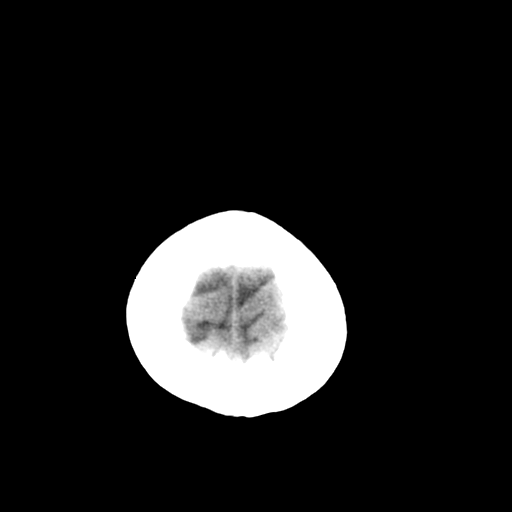
[im 29/32  bone]
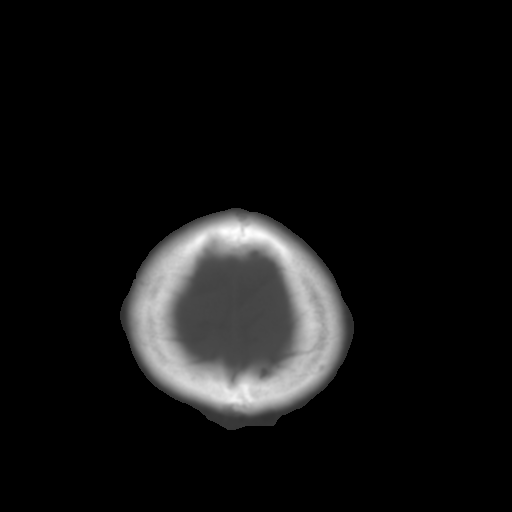

[Series 5: head bone · axial · 0.41mm/px · z∈[-63,-57]mm · 3 of 24 slices shown]
[im 3/24  bone]
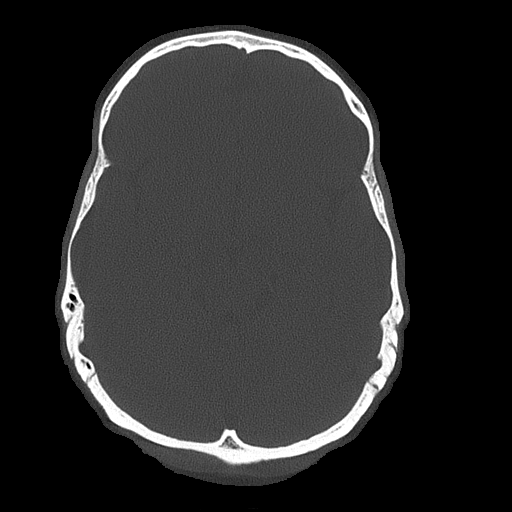
[im 5/24  bone]
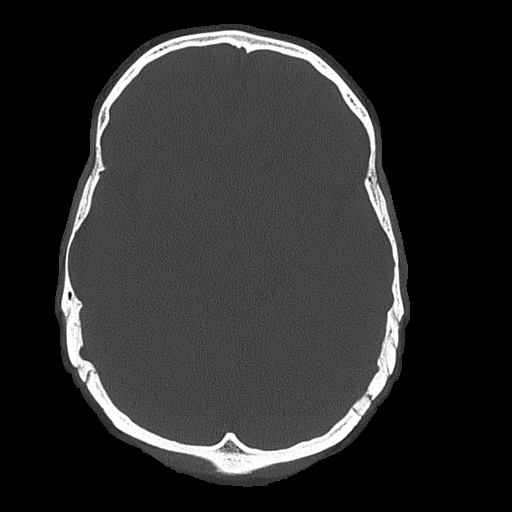
[im 7/24  bone]
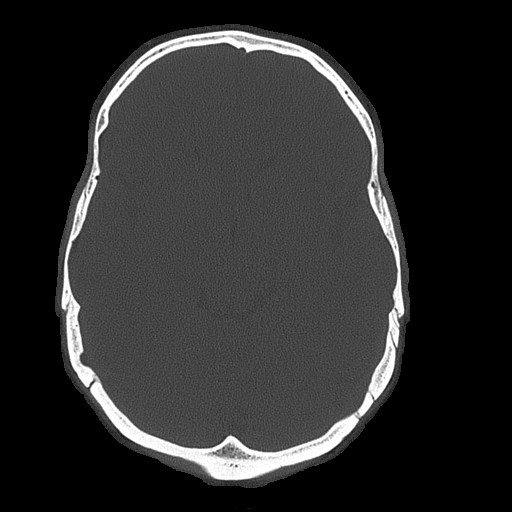

[16 of 30 positions shown; findings below may reference images not displayed]

FINDINGS: The ventricles are normal in size and configuration. There is no
mass, hemorrhage, extra-axial fluid collection, or midline shift.
Gray-white compartments are normal. No evidence suggesting acute
infarct. Bony calvarium appears intact. The mastoid air cells clear.
IMPRESSION: Study within normal limits. No demonstrable mass, hemorrhage, or
acute appearing infarct.

## 2018-05-21 IMAGING — CR DG HAND COMPLETE 3+V*R*
1 series · 3 of 3 positions shown · non-contrast
Comparison: None.

CLINICAL DATA: Patient slammed right hand in the front door this
morning. Bruising and swelling medially.

EXAM:
RIGHT HAND - COMPLETE 3+ VIEW

[Series 1: dg hand complete right · 0.14mm/px · 3 of 3 slices shown]
[im 1/3]
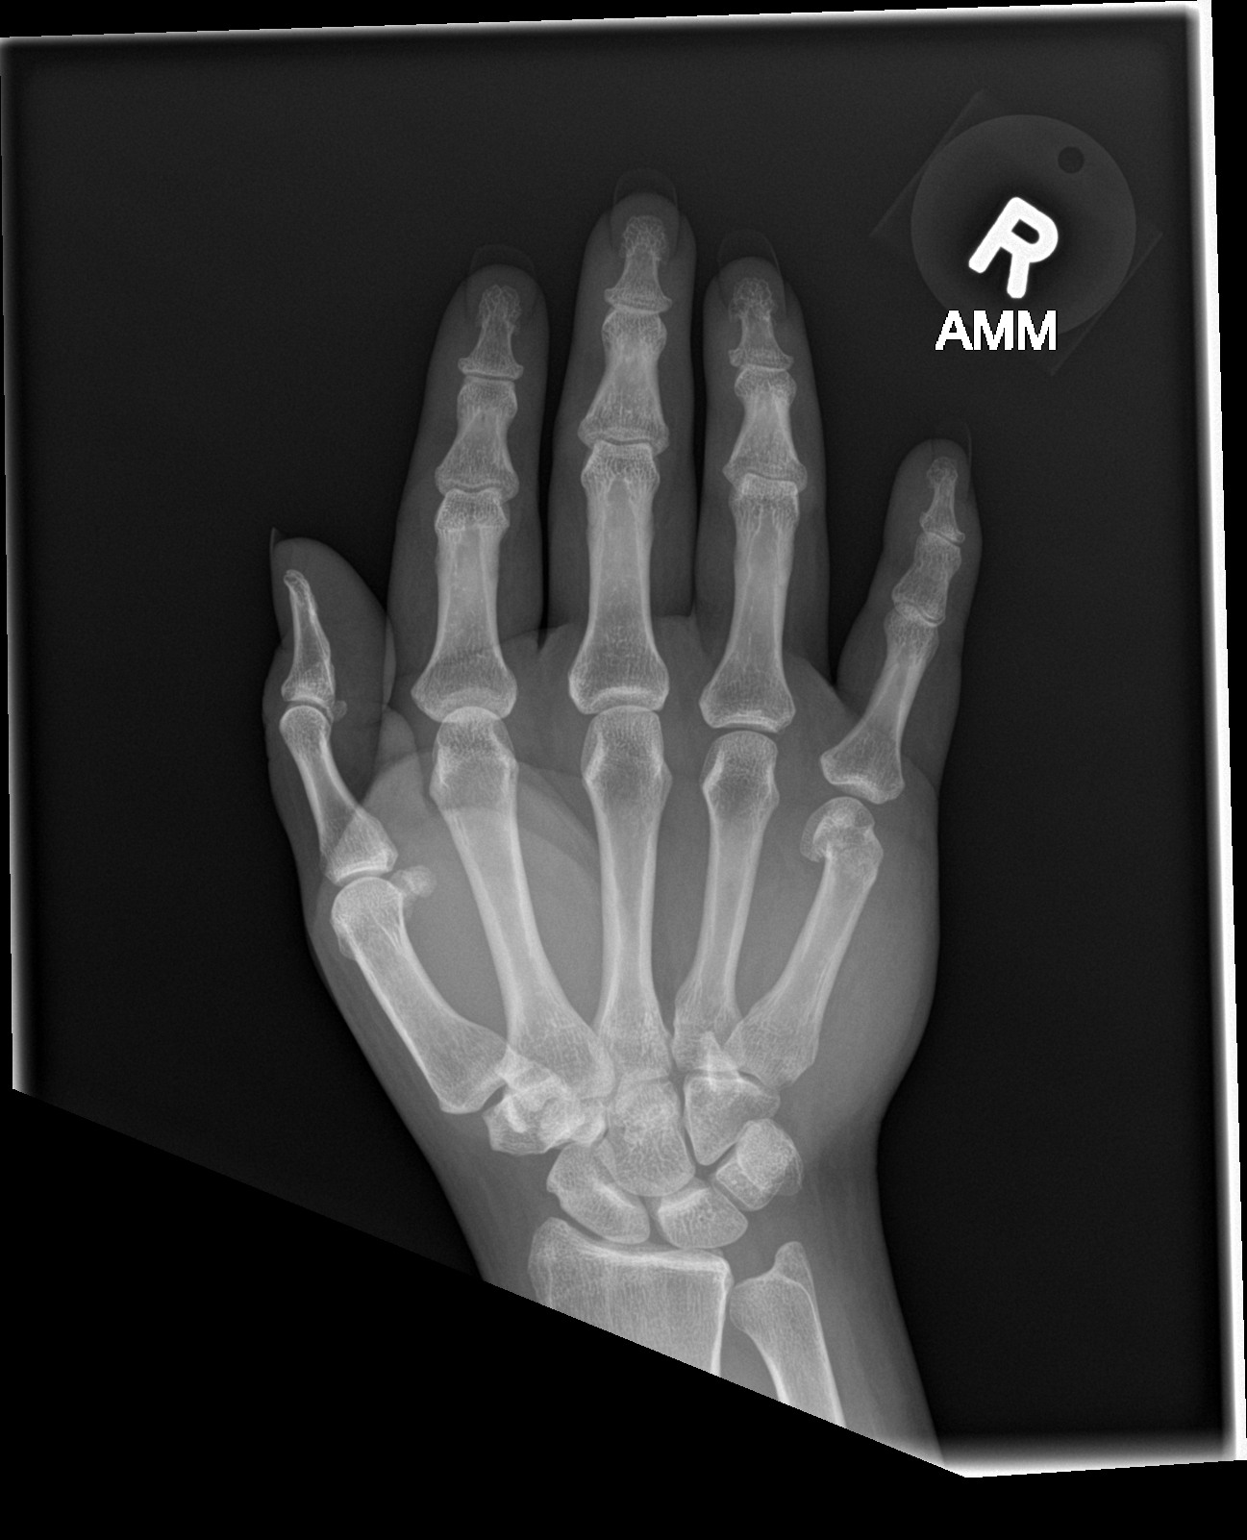
[im 2/3]
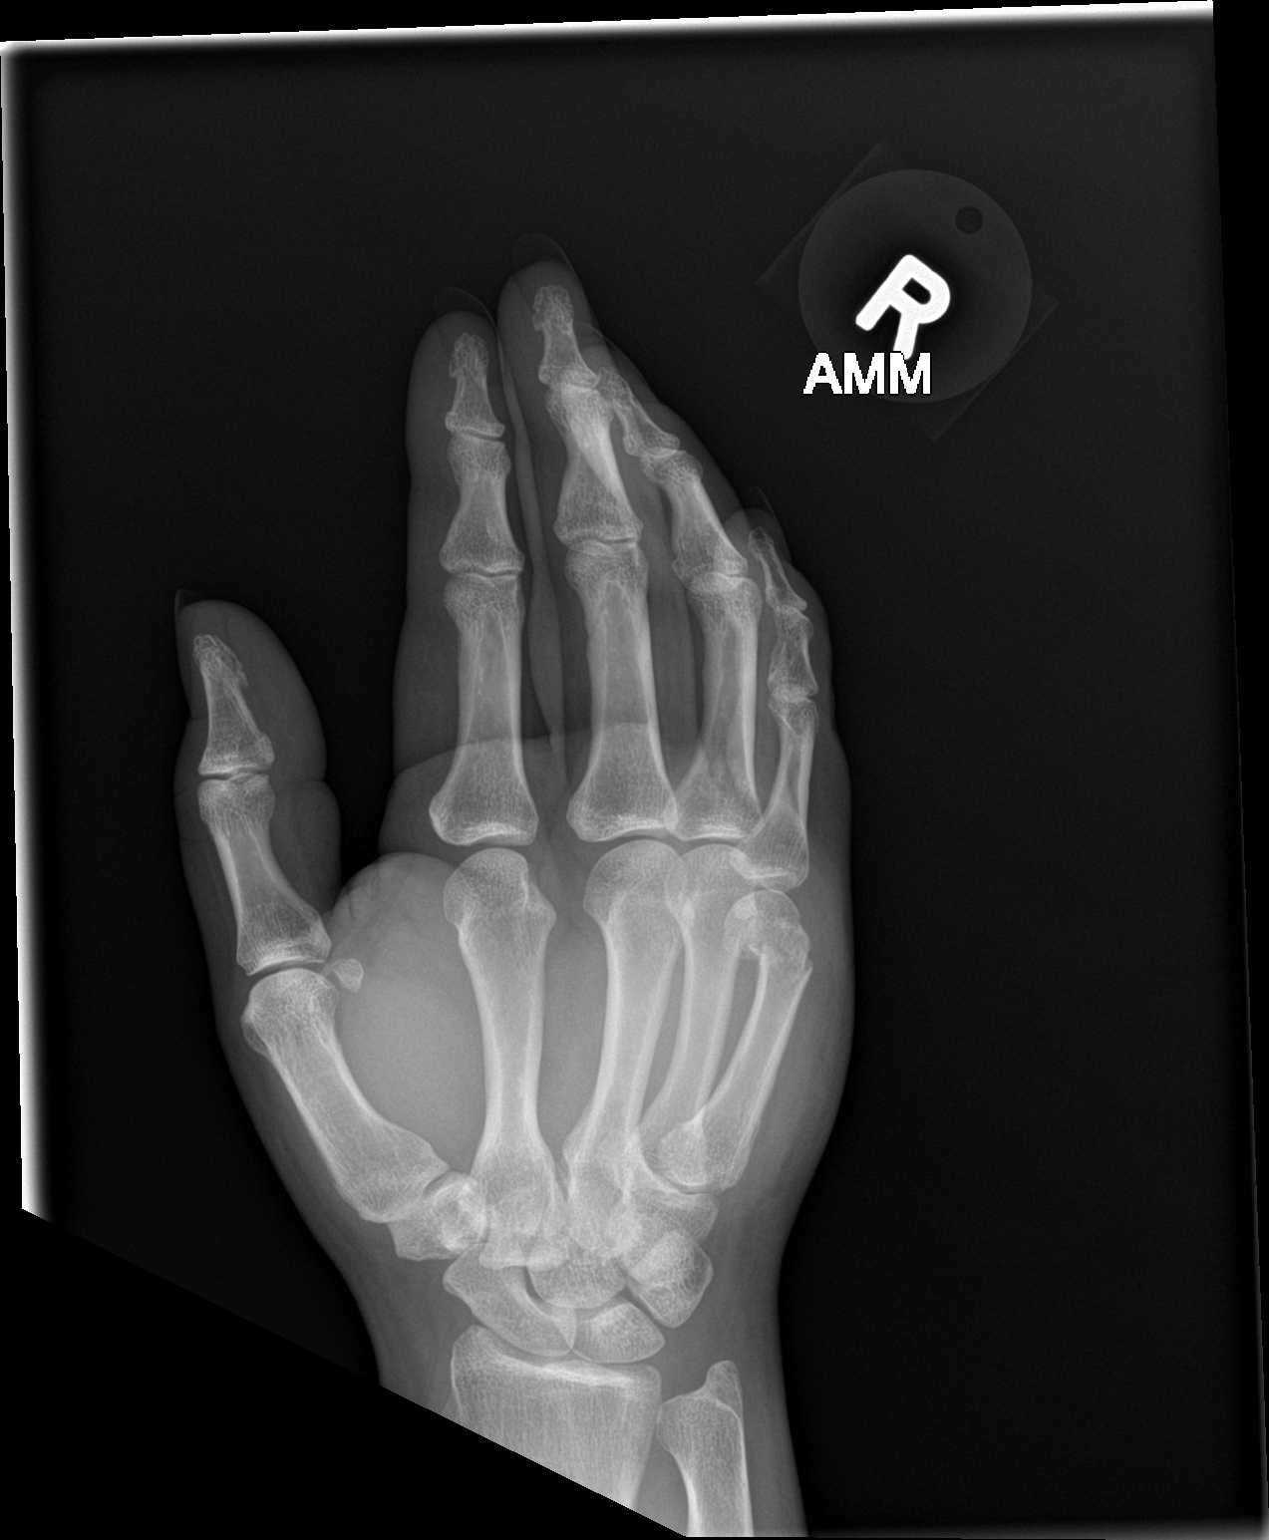
[im 3/3]
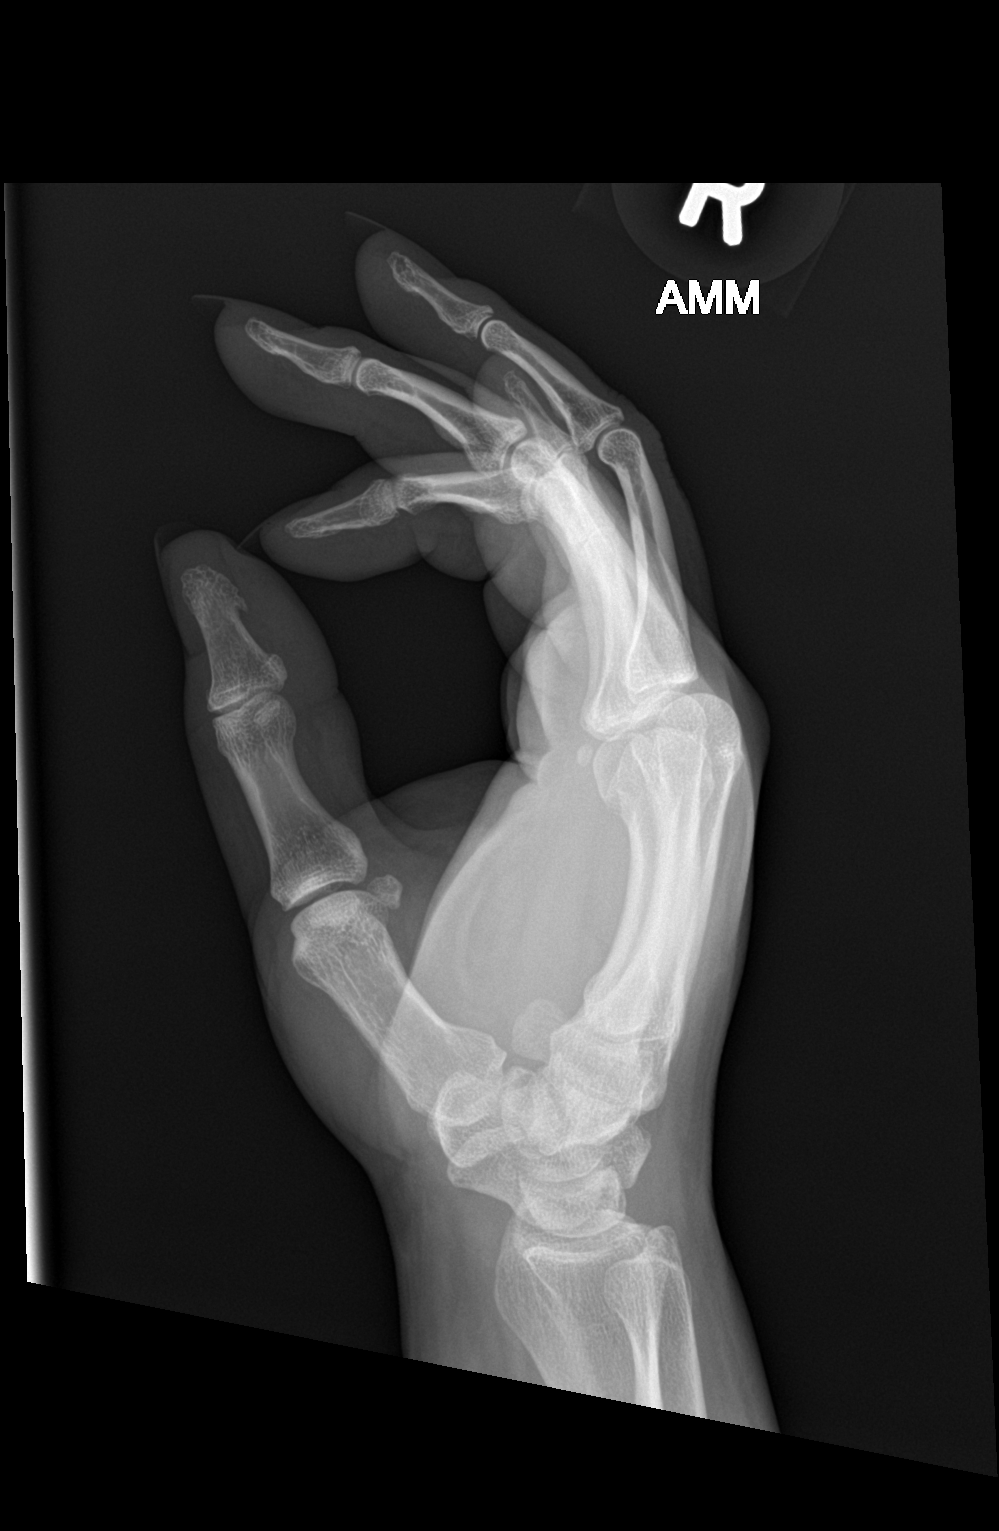

[3 of 3 positions shown; findings below may reference images not displayed]

FINDINGS: Acute fracture of the distal right fifth metacarpal bone with volar
angulation of the distal fracture fragments. Overlying soft tissue
swelling is present. No dislocation at the metacarpal phalangeal
joint. Right hand appears otherwise intact.
IMPRESSION: Fracture of the distal right fifth metacarpal bone with volar
angulation of the distal fracture fragment

## 2018-08-29 ENCOUNTER — Ambulatory Visit
Admission: EM | Admit: 2018-08-29 | Discharge: 2018-08-29 | Disposition: A | Discharge status: Home / Self Care | Payer: 59

## 2018-08-29 ENCOUNTER — Encounter: Payer: Self-pay | Admitting: Emergency Medicine

## 2018-08-29 ENCOUNTER — Other Ambulatory Visit: Payer: Self-pay

## 2018-08-29 DIAGNOSIS — W57XXXA Bitten or stung by nonvenomous insect and other nonvenomous arthropods, initial encounter: Secondary | ICD-10-CM

## 2018-08-29 DIAGNOSIS — S0006XA Insect bite (nonvenomous) of scalp, initial encounter: Secondary | ICD-10-CM | POA: Diagnosis not present

## 2018-08-29 MED ORDER — TRIAMCINOLONE ACETONIDE 0.5 % EX OINT: 1.0000 "application " | TOPICAL_OINTMENT | Freq: Two times a day (BID) | CUTANEOUS | 0 refills | Status: AC

## 2018-08-29 NOTE — ED Provider Notes (Signed)
[facility]    CSN: [id number] Arrival date & time: 08/29/18  1729     History   Chief Complaint Chief Complaint  Patient presents with  . Insect Bite    HPI [name] is a 38 y.o. female.   Onset of finding bites on her skin when she leaves work. She saw a bug crawling on the couch at work and grabbed and placed it in a zip lock bag and the exterminator looked at it saying it was a bed bug. The rest of this week, every time she is off and the bites heal, goes back to work and leaves with new bites. This am when she was feeling felt itching on her R neck and felt 3 bumps. Has another one of R forearm and one on abdomen. The ones on her legs from last week have resolved. Pt has checked her house to make sure is not coming from her home, but has not found anything, and none in her family has any bites. Pt works as a Lawyer and MED aid.      History reviewed. No pertinent past medical history.  There are no active problems to display for this patient.   Past Surgical History:  Procedure Laterality Date  . CESAREAN SECTION    . FRACTURE SURGERY      OB History   No obstetric history on file.      Home Medications    Prior to Admission medications   Medication Sig Start Date End Date Taking? Authorizing Provider  Prenatal Vit-Fe Fumarate-FA (PRENATAL VITAMIN PO) Take by mouth.   Yes [provider]  sertraline (ZOLOFT) 100 MG tablet Take by mouth. 08/11/17  Yes [provider]  docusate sodium (COLACE) 100 MG capsule Take 1 tablet once or twice daily as needed for constipation while taking narcotic pain medicine 02/27/16   Loleta Rose, MD  oxyCODONE-acetaminophen (ROXICET) 5-325 MG tablet Take 1-2 tablets by mouth every 4 (four) hours as needed for severe pain. 02/27/16   Loleta Rose, MD    Family History Family History  Problem Relation Age of Onset  . Healthy Mother   . Healthy Father     Social History Social History    Tobacco Use  . Smoking status: Current Every Day Smoker    Packs/day: 0.50    Types: Cigarettes  . Smokeless tobacco: Never Used  Substance Use Topics  . Alcohol use: No  . Drug use: Not Currently     Allergies   Patient has no known allergies.   Review of Systems Review of Systems  Constitutional: Negative for chills, diaphoresis and fever.  Musculoskeletal: Negative for arthralgias and myalgias.  Skin: Positive for rash.       Itching skin     Physical Exam Triage Vital Signs ED Triage Vitals  Enc Vitals Group     BP 08/29/18 1804 (!) 127/51     Pulse Rate 08/29/18 1804 84     Resp 08/29/18 1804 18     Temp 08/29/18 1804 98.6 F (37 C)     Temp Source 08/29/18 1804 Oral     SpO2 08/29/18 1804 99 %     Weight 08/29/18 1800 235 lb (106.6 kg)     Height 08/29/18 1800 5\' 3"  (1.6 m)     Head Circumference --      Peak Flow --      Pain Score 08/29/18 1800 0     Pain Loc --  Pain Edu? --      Excl. in GC? --    No data found.  Updated Vital Signs BP (!) 127/51 (BP Location: Left Arm)   Pulse 84   Temp 98.6 F (37 C) (Oral)   Resp 18   Ht 5\' 3"  (1.6 m)   Wt 235 lb (106.6 kg)   LMP 07/04/2018   SpO2 99%   BMI 41.63 kg/m   Visual Acuity Right Eye Distance:   Left Eye Distance:   Bilateral Distance:    Right Eye Near:   Left Eye Near:    Bilateral Near:     Physical Exam Constitutional:      Appearance: She is obese.  HENT:     Head: Normocephalic.     Right Ear: External ear normal.     Left Ear: External ear normal.     Nose: Nose normal.  Eyes:     General: No scleral icterus.    Conjunctiva/sclera: Conjunctivae normal.  Neck:     Musculoskeletal: Neck supple.  Pulmonary:     Effort: Pulmonary effort is normal.  Musculoskeletal: Normal range of motion.  Skin:    General: Skin is warm and dry.     Comments: Has 1/2 x 1/x to 3/4 x 3/4 cm raised erythematous bumps typical look of insect bites. I do not see puncture wholes in the  center of them. They are not hot or oozing or painful.   Neurological:     Mental Status: She is alert and oriented to person, place, and time.     Gait: Gait normal.  Psychiatric:        Mood and Affect: Mood normal.        Behavior: Behavior normal.        Thought Content: Thought content normal.        Judgment: Judgment normal.      UC Treatments / Results  Labs (all labs ordered are listed, but only abnormal results are displayed) Labs Reviewed - No data to display  EKG None  Radiology No results found.  Procedures Procedures  Medications Ordered in UC Medications - No data to display  Initial Impression / Assessment and Plan / UC Course  I have reviewed the triage vital signs and the nursing notes. I believe she does have some kind of insect bite and the picture she showed me looks like a bed bug and the bites look to be similar as what I found online.  I advised to do good cleaning of her house and sheets, blankets. May continue Benadryl qhs, and take Claritin during the day if she needs to.  I gave her rx of triamcinolone 0.5 % to apply on bites.  She will talk with her manager about my findings and exam.  I reviewed symptoms of impetigo to watch out for. If so needs to be seen.     Final Clinical Impressions(s) / UC Diagnoses   Final diagnoses:  None   Discharge Instructions   None    ED Prescriptions    None     Controlled Substance Prescriptions [place] Controlled Substance Registry consulted?    Garey Ham, [place] 08/29/18 1859

## 2018-08-29 NOTE — ED Triage Notes (Signed)
Pt c/o inset bites on her neck, and arm. She noticed it last night. She had these last week too and they went away. She states that she thinks she is getting them at work.she found a a bug and an exterminator told her it was a bed bug.

## 2020-12-04 ENCOUNTER — Other Ambulatory Visit: Payer: Self-pay

## 2021-11-12 ENCOUNTER — Encounter: Payer: Self-pay | Admitting: Emergency Medicine

## 2021-11-12 ENCOUNTER — Ambulatory Visit
Admission: EM | Admit: 2021-11-12 | Discharge: 2021-11-12 | Disposition: A | Discharge status: Home / Self Care | Payer: 59

## 2021-11-12 ENCOUNTER — Other Ambulatory Visit: Payer: Self-pay

## 2021-11-12 DIAGNOSIS — R051 Acute cough: Secondary | ICD-10-CM

## 2021-11-12 HISTORY — DX: Migraine, unspecified, not intractable, without status migrainosus: G43.909

## 2021-11-12 MED ORDER — BENZONATATE 100 MG PO CAPS: 100.0000 mg | ORAL_CAPSULE | Freq: Three times a day (TID) | ORAL | 0 refills | Status: AC | PRN

## 2021-11-12 NOTE — Discharge Instructions (Signed)
You can take 2 Tessalon Perles at night before bed. ?Electronics engineer with Mucinex DM. ?Please take 10 mg of Zyrtec once daily. ?Continue 1 spray of Flonase each side. ?

## 2021-11-12 NOTE — ED Provider Notes (Signed)
? ?[facility] ?Provider Note ? ?Patient Contact: 12:25 PM (approximate) ? ? ?History  ? ?Cough ? ? ?HPI ? ?[name] is a 41 y.o. female presents to the urgent care with cough, rhinorrhea, pharyngitis and generalized malaise.  Patient's daughter has similar symptoms.  No chest pain, chest tightness, abdominal pain, vomiting or diarrhea. ? ?  ? ? ?Physical Exam  ? ?Triage Vital Signs: ?ED Triage Vitals  ?Enc Vitals Group  ?   BP 11/12/21 1155 (!) 144/97  ?   Pulse Rate 11/12/21 1155 (!) 123  ?   Resp 11/12/21 1155 14  ?   Temp 11/12/21 1155 97.7 ?F (36.5 ?C)  ?   Temp Source 11/12/21 1155 Oral  ?   SpO2 11/12/21 1155 100 %  ?   Weight 11/12/21 1151 250 lb (113.4 kg)  ?   Height 11/12/21 1151 5' 3.5" (1.613 m)  ?   Head Circumference --   ?   Peak Flow --   ?   Pain Score 11/12/21 1151 6  ?   Pain Loc --   ?   Pain Edu? --   ?   Excl. in GC? --   ? ? ?Most recent vital signs: ?Vitals:  ? 11/12/21 1155  ?BP: (!) 144/97  ?Pulse: (!) 123  ?Resp: 14  ?Temp: 97.7 ?F (36.5 ?C)  ?SpO2: 100%  ? ? ? ?Constitutional: Alert and oriented. Patient is lying supine. ?Eyes: Conjunctivae are normal. PERRL. EOMI. ?Head: Atraumatic. ?ENT: ?     Ears: Tympanic membranes are mildly injected with mild effusion bilaterally.  ?     Nose: No congestion/rhinnorhea. ?     Mouth/Throat: Mucous membranes are moist. Posterior pharynx is mildly erythematous.  ?Hematological/Lymphatic/Immunilogical: No cervical lymphadenopathy.  ?Cardiovascular: Normal rate, regular rhythm. Normal S1 and S2.  Good peripheral circulation. ?Respiratory: Normal respiratory effort without tachypnea or retractions. Lungs CTAB. Good air entry to the bases with no decreased or absent breath sounds. ?Gastrointestinal: Bowel sounds ?4 quadrants. Soft and nontender to palpation. No guarding or rigidity. No palpable masses. No distention. No CVA tenderness. ?Musculoskeletal: Full range of motion to all extremities. No gross deformities  appreciated. ?Neurologic:  Normal speech and language. No gross focal neurologic deficits are appreciated.  ?Skin:  Skin is warm, dry and intact. No rash noted. ?Psychiatric: Mood and affect are normal. Speech and behavior are normal. Patient exhibits appropriate insight and judgement. ? ? ?ED Results / Procedures / Treatments  ? ?Labs ?(all labs ordered are listed, but only abnormal results are displayed) ?Labs Reviewed  ?SARS CORONAVIRUS 2 (TAT 6-24 HRS)  ? ? ? ?PROCEDURES: ? ?Critical Care performed: No ? ?Procedures ? ? ?MEDICATIONS ORDERED IN ED: ?Medications - No data to display ? ? ?IMPRESSION / MDM / ASSESSMENT AND PLAN / ED COURSE  ?I reviewed the triage vital signs and the nursing notes. ?             ?               ? ?Assessment and plan ?Viral URI ?41 year old female presents to the urgent care with cough, rhinorrhea and pharyngitis. ? ?Patient was tachycardic at triage but vital signs otherwise reassuring.  She was alert, active and nontoxic-appearing.  Recommended rest and hydration at home as well as Tylenol and ibuprofen alternating for fever.  Also recommended Flonase, Zyrtec and Tessalon Perles. ? ?  ? ? ?FINAL CLINICAL IMPRESSION(S) / ED DIAGNOSES  ? ?Final diagnoses:  ?  Acute cough  ? ? ? ?Rx / DC Orders  ? ?ED Discharge Orders   ? ?      Ordered  ?  benzonatate (TESSALON PERLES) 100 MG capsule  3 times daily PRN       ? 11/12/21 1223  ? ?  ?  ? ?  ? ? ? ?Note:  This document was prepared using Dragon voice recognition software and may include unintentional dictation errors. ?  ?Orvil Feil, PA-C ?11/12/21 1226 ? ?

## 2021-11-12 NOTE — ED Triage Notes (Signed)
Patient c/o sore throat, cough and runny nose that started on Wed.  Patient denies fevers.  Patient has bee taking Tylenol with relief.   ?

## 2022-06-03 ENCOUNTER — Ambulatory Visit
Admission: EM | Admit: 2022-06-03 | Discharge: 2022-06-03 | Disposition: A | Discharge status: Home / Self Care | Payer: 59 | Attending: Family Medicine | Admitting: Family Medicine

## 2022-06-03 ENCOUNTER — Encounter: Payer: Self-pay | Admitting: Emergency Medicine

## 2022-06-03 DIAGNOSIS — R000 Tachycardia, unspecified: Secondary | ICD-10-CM | POA: Diagnosis present

## 2022-06-03 DIAGNOSIS — I10 Essential (primary) hypertension: Secondary | ICD-10-CM | POA: Diagnosis not present

## 2022-06-03 LAB — BASIC METABOLIC PANEL
Anion gap: 7 (ref 5–15)
BUN: 11 mg/dL (ref 6–20)
CO2: 24 mmol/L (ref 22–32)
Calcium: 9.3 mg/dL (ref 8.9–10.3)
Chloride: 106 mmol/L (ref 98–111)
Creatinine, Ser: 0.77 mg/dL (ref 0.44–1.00)
GFR, Estimated: >60 mL/min (ref >60)
Glucose, Bld: 104 mg/dL — ABNORMAL HIGH (ref 70–99)
Potassium: 3.7 mmol/L (ref 3.5–5.1)
Sodium: 137 mmol/L (ref 135–145)

## 2022-06-03 MED ORDER — TELMISARTAN 20 MG PO TABS
20.0000 mg | ORAL_TABLET | Freq: Every day | ORAL | 0 refills | Status: AC
Start: 2022-06-03 — End: 2022-07-03

## 2022-06-03 NOTE — ED Provider Notes (Signed)
[facility]    CSN: [id number] Arrival date & time: 06/03/22  1609      History   Chief Complaint Chief Complaint  Patient presents with   Headache   Hypertension    HPI [name] is a 41 y.o. female.   HPI   [name] presents for elevated blood pressure. For the past couple weeks she has been feeling "off." Has had palpitations, blurry vision, headaches. She went to her doctor and told her BP was high. She was told to monitor BP for the past 2 weeks.  BP at home 170s-150s / 90s. She has history of anxiety and feels like she has been acting up. Take Wellbutrin, Hydroxyzine and Effexor which helped. Today, BP was 178/94, she called her PCP and was advised to been seen at the [facility] or ED.     Had postpartum preeclampsia and took BP meds for 1 year. Denies chest pain, lower extremity edema, exertional dyspnea and lightheadedness.  Home BP Monitoring: Yes  Exercises:  does not exercise, reports being active at her job   Past Medical History:  Diagnosis Date   Migraines     There are no problems to display for this patient.   Past Surgical History:  Procedure Laterality Date   CESAREAN SECTION     FRACTURE SURGERY      OB History   No obstetric history on file.      Home Medications    Prior to Admission medications   Medication Sig Start Date End Date Taking? Authorizing Provider  buPROPion (WELLBUTRIN XL) 150 MG 24 hr tablet Take 150 mg by mouth daily.   Yes [provider]  hydrOXYzine (ATARAX) 25 MG tablet Take 25 mg by mouth 3 (three) times daily. 10/31/21  Yes [provider]  metFORMIN (GLUCOPHAGE) 500 MG tablet Take by mouth. 05/24/22 05/24/23 Yes [provider]  omeprazole (PRILOSEC) 40 MG capsule Take 40 mg by mouth daily. 11/09/21  Yes [provider]  SUMAtriptan (IMITREX) 50 MG tablet SMARTSIG:1 Tablet(s) By Mouth 10/31/21  Yes [provider]  telmisartan (MICARDIS) 20 MG tablet Take 1 tablet  (20 mg total) by mouth daily. 06/03/22 07/03/22 Yes Shylin Keizer, DO  topiramate (TOPAMAX) 100 MG tablet Take 100 mg by mouth daily. 11/03/21  Yes [provider]  venlafaxine XR (EFFEXOR-XR) 150 MG 24 hr capsule Take 150 mg by mouth daily. 11/10/21  Yes [provider]  Prenatal Vit-Fe Fumarate-FA (PRENATAL VITAMIN PO) Take by mouth.    [provider]  sertraline (ZOLOFT) 100 MG tablet Take by mouth. 08/11/17   [provider]  triamcinolone ointment (KENALOG) 0.5 % Apply 1 application topically 2 (two) times daily. Prn swelling from insect bites 08/29/18   Rodriguez-Southworth, Nettie Elm, PA-C    Family History Family History  Problem Relation Age of Onset   Healthy Mother    Healthy Father     Social History Social History   Tobacco Use   Smoking status: Former    Packs/day: 0.50    Types: Cigarettes   Smokeless tobacco: Never  Vaping Use   Vaping Use: Never used  Substance Use Topics   Alcohol use: No   Drug use: Not Currently     Allergies   Amoxicillin   Review of Systems Review of Systems   Physical Exam Triage Vital Signs ED Triage Vitals  Enc Vitals Group     BP 06/03/22 1655 (!) 144/96     Pulse Rate 06/03/22 1655 (!)  120     Resp 06/03/22 1655 14     Temp 06/03/22 1655 98.1 F (36.7 C)     Temp Source 06/03/22 1655 Oral     SpO2 06/03/22 1655 100 %     Weight 06/03/22 1651 270 lb (122.5 kg)     Height 06/03/22 1651 5' 3.5" (1.613 m)     Head Circumference --      Peak Flow --      Pain Score 06/03/22 1651 4     Pain Loc --      Pain Edu? --      Excl. in GC? --    No data found.  Updated Vital Signs BP (!) 144/96 (BP Location: Right Arm)   Pulse (!) 120   Temp 98.1 F (36.7 C) (Oral)   Resp 14   Ht 5' 3.5" (1.613 m)   Wt 122.5 kg   LMP 05/27/2022 (Approximate)   SpO2 100%   BMI 47.08 kg/m   Visual Acuity Right Eye Distance:   Left Eye Distance:   Bilateral Distance:    Right Eye Near:   Left Eye  Near:    Bilateral Near:     Physical Exam GEN:     alert, female in no acute distress   HENT:  mucus membranes moist, nares patent, no nasal discharge  EYES:   pupils equal and reactive, EOM intact NECK:  supple, normal ROM RESP:  clear to auscultation bilaterally, no increased work of breathing  CVS:   regular rate and rhythm, no murmur, distal pulses intact  EXT:   normal ROM, atraumatic, no edema  NEURO:  normal without focal findings,  speech normal, alert and oriented   Skin:   warm and dry Psych: Normal affect, appropriate speech and behavior    UC Treatments / Results  Labs (all labs ordered are listed, but only abnormal results are displayed) Labs Reviewed  BASIC METABOLIC PANEL - Abnormal; Notable for the following components:      Result Value   Glucose, Bld 104 (*)    All other components within normal limits    EKG   Radiology No results found.  Procedures Procedures (including critical care time)  Medications Ordered in UC Medications - No data to display  Initial Impression / Assessment and Plan / UC Course  I have reviewed the triage vital signs and the nursing notes.  Pertinent labs & imaging results that were available during my care of the patient were reviewed by me and considered in my medical decision making (see chart for details).      Patient is a 41 year old female who presents for headache and elevated blood pressures at home.  She has been speaking with her PCP about this and working on lifestyle changes.  Reviewed the last office note regarding blood pressures in May 24, 2022.  Her blood pressures was elevated at her encounter with her PCP.  Reports her home blood pressures are between systolically 140-170 / 90s.  She is having headaches and blurry vision.  Patient is interested in starting a medication for hypertension.  BMP obtained to assess electrolytes and kidney function.  These were grossly unremarkable.  Start telmisartan 20 mg  today.  She is to continue to monitor/log her blood pressures at home and follow-up with her primary care provider in the next 2 weeks.   Reports palpitations.  She is also tachycardic.  Heart rate 120s.  EKG showed sinus tachycardia without acute ST or T  wave changes.  No signs of ischemia.  On chart review, this is not new for her.  She may be a candidate for a beta-blocker as well.  She is to speak with her primary care provider about this.  Discussed MDM, treatment plan and plan for follow-up with patient/parent who agrees with plan.      Final Clinical Impressions(s) / UC Diagnoses   Final diagnoses:  Essential hypertension  Tachycardia     Discharge Instructions      Your EKG showed an elevated heart rate but otherwise was normal.  Your blood work today including your kidney function was also normal.  Please monitor your blood pressure at home first thing in the morning prior to eating and drinking and journal this for PCP follow-up. If you remain persistently elevated after your symptoms improve, you may need medical management. Other recommendations for high blood pressure are to decrease the amount of salt in your diet, exercise (150 minutes of moderate intensity exercise weekly), weight loss.  You should follow up with your primary doctor in 2-3 days regarding today's urgent care visit. If your symptoms persist, you may need a additional cardiovascular evaluation.  Go to the emergency department if you develop inability to keep down fluids, severe diarrhea, blood in your vomit or stools, worsening abdominal pain, fever over 100.4 F that is not resolved with Tylenol, severe chest pain, you pass out, or any additional symptoms that concern you.        ED Prescriptions     Medication Sig Dispense Auth. Provider   telmisartan (MICARDIS) 20 MG tablet Take 1 tablet (20 mg total) by mouth daily. 30 tablet Katha Cabal, DO      PDMP not reviewed this encounter.    Katha Cabal, DO 06/05/22 [phone]

## 2022-06-03 NOTE — Discharge Instructions (Signed)
Your EKG showed an elevated heart rate but otherwise was normal.  Your blood work today including your kidney function was also normal.  Please monitor your blood pressure at home first thing in the morning prior to eating and drinking and journal this for PCP follow-up. If you remain persistently elevated after your symptoms improve, you may need medical management. Other recommendations for high blood pressure are to decrease the amount of salt in your diet, exercise (150 minutes of moderate intensity exercise weekly), weight loss.  You should follow up with your primary doctor in 2-3 days regarding today's urgent care visit. If your symptoms persist, you may need a additional cardiovascular evaluation.  Go to the emergency department if you develop inability to keep down fluids, severe diarrhea, blood in your vomit or stools, worsening abdominal pain, fever over 100.4 F that is not resolved with Tylenol, severe chest pain, you pass out, or any additional symptoms that concern you.

## 2022-06-03 NOTE — ED Triage Notes (Signed)
Patient states that when she was seen on 11/21 or 22 at her PCP office and her blood pressure was elevated.  Patient states that she was told to monitor her blood pressure at home.  Patient states that her blood pressure has remained elevated since her visit.  Patient states today her blood pressure 174/78.  Patient states that she called the triage line and was told to come here.  Patient reports headache and blurry vision.  Patient denies chest pain.

## 2023-04-12 ENCOUNTER — Encounter: Payer: Self-pay | Admitting: Emergency Medicine

## 2023-04-12 ENCOUNTER — Ambulatory Visit
Admission: EM | Admit: 2023-04-12 | Discharge: 2023-04-12 | Disposition: A | Discharge status: Home / Self Care | Payer: 59 | Attending: Family Medicine | Admitting: Family Medicine

## 2023-04-12 ENCOUNTER — Ambulatory Visit (INDEPENDENT_AMBULATORY_CARE_PROVIDER_SITE_OTHER): Payer: 59

## 2023-04-12 DIAGNOSIS — R000 Tachycardia, unspecified: Secondary | ICD-10-CM | POA: Diagnosis not present

## 2023-04-12 DIAGNOSIS — M25521 Pain in right elbow: Secondary | ICD-10-CM

## 2023-04-12 MED ORDER — DEXAMETHASONE SODIUM PHOSPHATE 10 MG/ML IJ SOLN
10.0000 mg | Freq: Once | INTRAMUSCULAR | Status: AC
  Administered 2023-04-12: 10 mg via INTRAMUSCULAR

## 2023-04-12 MED ORDER — NAPROXEN 500 MG PO TABS: 500.0000 mg | ORAL_TABLET | Freq: Two times a day (BID) | ORAL | 0 refills | Status: AC

## 2023-04-12 MED ORDER — METHOCARBAMOL 500 MG PO TABS: 500.0000 mg | ORAL_TABLET | Freq: Two times a day (BID) | ORAL | 0 refills | Status: AC

## 2023-04-12 NOTE — ED Triage Notes (Addendum)
Pt presents with right elbow pain x 1 week. Pt denies any injury. Pt has tried ice and OTC pain medication. Pt also has occasional numbness and tingling in her fingers.

## 2023-04-12 NOTE — ED Provider Notes (Addendum)
[facility]    CSN: [id number] Arrival date & time: 04/12/23  1453      History   Chief Complaint Chief Complaint  Patient presents with   Elbow Pain    HPI  HPI [name] is a 42 y.o. female.   [name] presents for lateral right elbow pain that started a week ago. She doesn't recall what she did to it but denies falls.  No previous injury. Pain radiates to her shoulder. Has sharp stinging pain.  Took some Tylenol and ibuprofen without relief. Ice numbed the pain.  She doesn't hear any abnormal pops or sounds. No known history of gout or arthritis.  Her dad and sister have gout.  She is right handed.      Past Medical History:  Diagnosis Date   Migraines     There are no problems to display for this patient.   Past Surgical History:  Procedure Laterality Date   CESAREAN SECTION     FRACTURE SURGERY      OB History   No obstetric history on file.      Home Medications    Prior to Admission medications   Medication Sig Start Date End Date Taking? Authorizing Provider  methocarbamol (ROBAXIN) 500 MG tablet Take 1 tablet (500 mg total) by mouth 2 (two) times daily. 04/12/23  Yes Tyreke Kaeser, DO  naproxen (NAPROSYN) 500 MG tablet Take 1 tablet (500 mg total) by mouth 2 (two) times daily with a meal. 04/12/23  Yes Ormand Senn, DO  buPROPion (WELLBUTRIN XL) 150 MG 24 hr tablet Take 150 mg by mouth daily.    [provider]  hydrOXYzine (ATARAX) 25 MG tablet Take 25 mg by mouth 3 (three) times daily. 10/31/21   [provider]  metFORMIN (GLUCOPHAGE) 500 MG tablet Take by mouth. 05/24/22 05/24/23  [provider]  omeprazole (PRILOSEC) 40 MG capsule Take 40 mg by mouth daily. 11/09/21   [provider]  Prenatal Vit-Fe Fumarate-FA (PRENATAL VITAMIN PO) Take by mouth.    [provider]  sertraline (ZOLOFT) 100 MG tablet Take by mouth. 08/11/17   [provider]  SUMAtriptan (IMITREX) 50 MG  tablet SMARTSIG:1 Tablet(s) By Mouth 10/31/21   [provider]  telmisartan (MICARDIS) 20 MG tablet Take 1 tablet (20 mg total) by mouth daily. 06/03/22 07/03/22  Katha Cabal, DO  topiramate (TOPAMAX) 100 MG tablet Take 100 mg by mouth daily. 11/03/21   [provider]  triamcinolone ointment (KENALOG) 0.5 % Apply 1 application topically 2 (two) times daily. Prn swelling from insect bites 08/29/18   Rodriguez-Southworth, Nettie Elm, PA-C  venlafaxine XR (EFFEXOR-XR) 150 MG 24 hr capsule Take 150 mg by mouth daily. 11/10/21   [provider]    Family History Family History  Problem Relation Age of Onset   Healthy Mother    Healthy Father     Social History Social History   Tobacco Use   Smoking status: Former    Current packs/day: 0.50    Types: Cigarettes   Smokeless tobacco: Never  Vaping Use   Vaping status: Every Day  Substance Use Topics   Alcohol use: No   Drug use: Not Currently     Allergies   Amoxicillin   Review of Systems Review of Systems: :negative unless otherwise stated in HPI.      Physical Exam Triage Vital Signs ED Triage Vitals  Encounter Vitals Group     BP 04/12/23 1535 (!) 150/84  Systolic BP Percentile --      Diastolic BP Percentile --      Pulse Rate 04/12/23 1535 (!) 115     Resp 04/12/23 1535 18     Temp 04/12/23 1535 98.6 F (37 C)     Temp Source 04/12/23 1535 Oral     SpO2 04/12/23 1535 100 %     Weight --      Height --      Head Circumference --      Peak Flow --      Pain Score 04/12/23 1533 3     Pain Loc --      Pain Education --      Exclude from Growth Chart --    No data found.  Updated Vital Signs BP (!) 150/84 (BP Location: Right Arm)   Pulse (!) 115   Temp 98.6 F (37 C) (Oral)   Resp 18   LMP 04/03/2023 (Approximate)   SpO2 100%   Visual Acuity Right Eye Distance:   Left Eye Distance:   Bilateral Distance:    Right Eye Near:   Left Eye Near:    Bilateral Near:      Physical Exam GEN: well appearing female in no acute distress  CVS: well perfused, tachycardic, regular rhythm  RESP: speaking in full sentences without pause, no respiratory distress, clear bilaterally MSK: Right upper extremity Good range of motion of the shoulder Good range of motion of the elbow though pain with range of motion  + Erythema, warmth and edema No evidence of bony deformity Tenderness over the lateral epicondyle No tenderness over olecranon Full passive (ABD, ADD, Flexion, extension, pronation and supination). Strength 5/5 grip, elbow and shoulder.  Sensation intact. Peripheral pulses intact.   UC Treatments / Results  Labs (all labs ordered are listed, but only abnormal results are displayed) Labs Reviewed - No data to display  EKG   Radiology DG Elbow Complete Right  Result Date: 04/12/2023 CLINICAL DATA:  Lateral pain for 1 week. EXAM: RIGHT ELBOW - COMPLETE 3+ VIEW COMPARISON:  None Available. FINDINGS: There is no evidence of fracture, dislocation, or joint effusion. Subcortical cystic change in corticated density adjacent to the lateral humeral epicondyle. Small corticated density is also seen adjacent to the ulnar trochlear articulation. No erosive change or focal bone abnormality. Soft tissues are unremarkable. IMPRESSION: 1. Subcortical cystic change and corticated density adjacent to the lateral humeral epicondyle, can be seen with lateral epicondylitis. 2. Small corticated density adjacent to the ulnar trochlear articulation may be sequela of remote injury. Electronically Signed   By: Narda Rutherford M.D.   On: 04/12/2023 19:02     Procedures Procedures (including critical care time)  Medications Ordered in UC Medications  dexamethasone (DECADRON) injection 10 mg (10 mg Intramuscular Given 04/12/23 1724)    Initial Impression / Assessment and Plan / UC Course  I have reviewed the triage vital signs and the nursing notes.  Pertinent labs &  imaging results that were available during my care of the patient were reviewed by me and considered in my medical decision making (see chart for details).      Pt is a 42 y.o.  female with 1 week of right elbow pain without known injury.   On exam, pt has tenderness at lateral epicondyle concerning for possible fracture.   Obtained right elbow plain films.  Personally interpreted by me were unremarkable for fracture or dislocation. Patient aware the radiologist has not read her xray  and is comfortable with the preliminary read by me. Will review radiologist read when available and call patient if a change in plan is warranted.  Pt agreeable to this plan prior to discharge.  Patient given Decadron 10 mg IM for suspected lateral epicondylitis versus bursitis versus tendinitis.  Patient to gradually return to normal activities, as tolerated and continue ordinary activities within the limits permitted by pain. Prescribed Naproxen sodium  and muscle relaxer  for pain relief.  Tylenol PRN. Advised patient to avoid OTC NSAIDs while taking prescription NSAID. Counseled patient on red flag symptoms and when to seek immediate care.  Patient to follow up with orthopedic provider, if symptoms do not improve with conservative treatment.   She is tachycardic here.  Heart rate 110s. On chart review, this is not new for her.  I previously recommended she speak with her primary care doctor about a beta-blocker and she states her primary care doctor reports that they will just watch it for now.  She continues to be tachycardic.     Return and ED precautions given. Understanding voiced. Discussed MDM, treatment plan and plan for follow-up with patient who agrees with plan.   Radiologist impression reviewed and no change in plan indicated.  Final Clinical Impressions(s) / UC Diagnoses   Final diagnoses:  Right elbow pain  Tachycardia     Discharge Instructions      On my review of your xray images, you did  not have any fractures or dislocated bones. The radiologist has not yet read your xray. If it is significantly abnormal or urgent, someone will contact you.  You should see your results in MyChart.   If medication was prescribed, stop by the pharmacy to pick up your prescriptions.  For your  pain, Take 1500 mg Tylenol twice a day, take muscle relaxer (Robaxin) twice a day, take Naprosyn twice a day,  as needed for pain. Apply a compressive sleeve or elbow strap. Rest and elevate the affected painful area.  Apply warm compresses intermittently, as needed.  As pain recedes, begin normal activities slowly as tolerated.  Follow up with an sports medicine or orthopedic provider, if symptoms persist.       ED Prescriptions     Medication Sig Dispense Auth. Provider   naproxen (NAPROSYN) 500 MG tablet Take 1 tablet (500 mg total) by mouth 2 (two) times daily with a meal. 30 tablet Jaimon Bugaj, DO   methocarbamol (ROBAXIN) 500 MG tablet Take 1 tablet (500 mg total) by mouth 2 (two) times daily. 20 tablet Katha Cabal, DO      PDMP not reviewed this encounter.   Katha Cabal, DO 04/12/23 1942    Katha Cabal, DO 04/12/23 1943

## 2023-04-12 NOTE — Discharge Instructions (Addendum)
On my review of your xray images, you did not have any fractures or dislocated bones. The radiologist has not yet read your xray. If it is significantly abnormal or urgent, someone will contact you.  You should see your results in MyChart.   If medication was prescribed, stop by the pharmacy to pick up your prescriptions.  For your  pain, Take 1500 mg Tylenol twice a day, take muscle relaxer (Robaxin) twice a day, take Naprosyn twice a day,  as needed for pain. Apply a compressive sleeve or elbow strap. Rest and elevate the affected painful area.  Apply warm compresses intermittently, as needed.  As pain recedes, begin normal activities slowly as tolerated.  Follow up with an sports medicine or orthopedic provider, if symptoms persist.

## 2023-07-06 ENCOUNTER — Ambulatory Visit
Admission: EM | Admit: 2023-07-06 | Discharge: 2023-07-06 | Disposition: A | Discharge status: Home / Self Care | Payer: 59 | Attending: Family Medicine | Admitting: Family Medicine

## 2023-07-06 DIAGNOSIS — H6992 Unspecified Eustachian tube disorder, left ear: Secondary | ICD-10-CM | POA: Diagnosis not present

## 2023-07-06 DIAGNOSIS — J069 Acute upper respiratory infection, unspecified: Secondary | ICD-10-CM | POA: Diagnosis not present

## 2023-07-06 MED ORDER — DOXYCYCLINE HYCLATE 100 MG PO CAPS: 100.0000 mg | ORAL_CAPSULE | Freq: Two times a day (BID) | ORAL | 0 refills | Status: AC

## 2023-07-06 MED ORDER — BENZONATATE 100 MG PO CAPS: 200.0000 mg | ORAL_CAPSULE | Freq: Three times a day (TID) | ORAL | 0 refills | Status: DC

## 2023-07-06 MED ORDER — PROMETHAZINE-DM 6.25-15 MG/5ML PO SYRP: 5.0000 mL | ORAL_SOLUTION | Freq: Four times a day (QID) | ORAL | 0 refills | Status: DC | PRN

## 2023-07-06 MED ORDER — IPRATROPIUM BROMIDE 0.06 % NA SOLN: 2.0000 | Freq: Four times a day (QID) | NASAL | 12 refills | Status: AC

## 2023-07-06 NOTE — ED Provider Notes (Signed)
 [facility]    CSN: [id number] Arrival date & time: 07/06/23  1404      History   Chief Complaint Chief Complaint  Patient presents with   Nasal Congestion   Ear Problem         HPI [name] is a 43 y.o. female.   HPI  43 year old female with a past medical history significant for migraine headaches, obesity, hypertension, and major depressive disorder for evaluation of 9 days with the respiratory symptoms.  These include runny nose, nasal congestion, sinus pain, sore throat, nonproductive cough, and decreased hearing in her right ear.  She denies any fever, shortness breath, or wheezing.  She reports that she has attempted to use the Valsalva maneuver to clear her left ear without any improvement of symptoms.  Patient reports that she has taken her blood pressure medication today though her blood pressure is elevated at 156/105.  She does endorse taking Tylenol  severe cold and flu which has phenylephrine which most likely creased her blood pressure.  Past Medical History:  Diagnosis Date   Migraines     There are no active problems to display for this patient.   Past Surgical History:  Procedure Laterality Date   CESAREAN SECTION     FRACTURE SURGERY      OB History   No obstetric history on file.      Home Medications    Prior to Admission medications   Medication Sig Start Date End Date Taking? Authorizing Provider  benzonatate  (TESSALON ) 100 MG capsule Take 2 capsules (200 mg total) by mouth every 8 (eight) hours. 07/06/23  Yes Bernardino Ditch, NP  doxycycline  (VIBRAMYCIN ) 100 MG capsule Take 1 capsule (100 mg total) by mouth 2 (two) times daily for 7 days. 07/06/23 07/13/23 Yes Bernardino Ditch, NP  ipratropium (ATROVENT ) 0.06 % nasal spray Place 2 sprays into both nostrils 4 (four) times daily. 07/06/23  Yes Bernardino Ditch, NP  promethazine -dextromethorphan (PROMETHAZINE -DM) 6.25-15 MG/5ML syrup Take 5 mLs by mouth 4 (four) times daily as needed. 07/06/23   Yes Bernardino Ditch, NP  buPROPion (WELLBUTRIN XL) 150 MG 24 hr tablet Take 150 mg by mouth daily.   Yes [provider]  hydrOXYzine (ATARAX) 25 MG tablet Take 25 mg by mouth 3 (three) times daily. 10/31/21  Yes [provider]  metFORMIN (GLUCOPHAGE) 500 MG tablet Take by mouth. 05/24/22 05/24/23  [provider]  methocarbamol  (ROBAXIN ) 500 MG tablet Take 1 tablet (500 mg total) by mouth 2 (two) times daily. 04/12/23  Yes Brimage, Vondra, DO  naproxen  (NAPROSYN ) 500 MG tablet Take 1 tablet (500 mg total) by mouth 2 (two) times daily with a meal. 04/12/23  Yes Brimage, Vondra, DO  omeprazole (PRILOSEC) 40 MG capsule Take 40 mg by mouth daily. 11/09/21  Yes [provider]  Prenatal Vit-Fe Fumarate-FA (PRENATAL VITAMIN PO) Take by mouth.   Yes [provider]  sertraline (ZOLOFT) 100 MG tablet Take by mouth. 08/11/17  Yes [provider]  SUMAtriptan (IMITREX) 50 MG tablet SMARTSIG:1 Tablet(s) By Mouth 10/31/21  Yes [provider]  telmisartan  (MICARDIS ) 20 MG tablet Take 1 tablet (20 mg total) by mouth daily. 06/03/22 07/03/22  Brimage, Vondra, DO  topiramate (TOPAMAX) 100 MG tablet Take 100 mg by mouth daily. 11/03/21  Yes [provider]  triamcinolone  ointment (KENALOG ) 0.5 % Apply 1 application topically 2 (two) times daily. Prn swelling from insect bites 08/29/18  Yes Rodriguez-Southworth, Kyra, PA-C  venlafaxine XR (EFFEXOR-XR) 150 MG  24 hr capsule Take 150 mg by mouth daily. 11/10/21  Yes [provider]    Family History Family History  Problem Relation Age of Onset   Healthy Mother    Healthy Father     Social History Social History   Tobacco Use   Smoking status: Former    Current packs/day: 0.50    Types: Cigarettes   Smokeless tobacco: Never  Vaping Use   Vaping status: Every Day  Substance Use Topics   Alcohol use: No   Drug use: Not Currently     Allergies   Amoxicillin   Review of  Systems Review of Systems  Constitutional:  Negative for fever.  HENT:  Positive for congestion, ear pain, hearing loss, rhinorrhea, sinus pressure and sore throat.   Respiratory:  Positive for cough. Negative for shortness of breath and wheezing.      Physical Exam Triage Vital Signs ED Triage Vitals  Encounter Vitals Group     BP      Systolic BP Percentile      Diastolic BP Percentile      Pulse      Resp      Temp      Temp src      SpO2      Weight      Height      Head Circumference      Peak Flow      Pain Score      Pain Loc      Pain Education      Exclude from Growth Chart    No data found.  Updated Vital Signs BP (!) 156/105 (BP Location: Left Arm)   Pulse (!) 103   Temp 98.2 F (36.8 C) (Oral)   Ht 5' 4 (1.626 m)   Wt 270 lb (122.5 kg)   LMP 06/21/2023   SpO2 93%   BMI 46.35 kg/m   Visual Acuity Right Eye Distance:   Left Eye Distance:   Bilateral Distance:    Right Eye Near:   Left Eye Near:    Bilateral Near:     Physical Exam Vitals and nursing note reviewed.  Constitutional:      Appearance: Normal appearance. She is not ill-appearing.  HENT:     Head: Normocephalic and atraumatic.     Right Ear: Tympanic membrane, ear canal and external ear normal. There is no impacted cerumen.     Left Ear: Tympanic membrane, ear canal and external ear normal. There is no impacted cerumen.     Nose: Congestion and rhinorrhea present.     Comments: Nasal mucosa is erythematous and edematous with yellow discharge in both nares.    Mouth/Throat:     Mouth: Mucous membranes are moist.     Pharynx: Oropharynx is clear. Posterior oropharyngeal erythema present. No oropharyngeal exudate.     Comments: Mild erythema to the posterior oropharynx with yellow postnasal drip. Cardiovascular:     Rate and Rhythm: Normal rate and regular rhythm.     Pulses: Normal pulses.     Heart sounds: Normal heart sounds. No murmur heard.    No friction rub. No gallop.   Pulmonary:     Effort: Pulmonary effort is normal.     Breath sounds: Normal breath sounds. No wheezing, rhonchi or rales.  Musculoskeletal:     Cervical back: Normal range of motion and neck supple. No tenderness.  Lymphadenopathy:     Cervical: No cervical adenopathy.  Skin:  General: Skin is warm and dry.     Capillary Refill: Capillary refill takes less than 2 seconds.     Findings: No rash.  Neurological:     General: No focal deficit present.     Mental Status: She is alert.      UC Treatments / Results  Labs (all labs ordered are listed, but only abnormal results are displayed) Labs Reviewed - No data to display  EKG   Radiology No results found.  Procedures Procedures (including critical care time)  Medications Ordered in UC Medications - No data to display  Initial Impression / Assessment and Plan / UC Course  I have reviewed the triage vital signs and the nursing notes.  Pertinent labs & imaging results that were available during my care of the patient were reviewed by me and considered in my medical decision making (see chart for details).   Patient is a pleasant, nontoxic-appearing 43 year old female presenting for evaluation of 9 days worth of respiratory symptoms as outlined HPI above.  Her physical exam does reveal inflamed nasal mucosa with yellow rhinorrhea.  Posterior pharynx is also erythematous with yellow postnasal drip.  No cervical lymphadenopathy appreciated on exam.  Cardiopulmonary exam reveals clear lung sounds in all fields.  She is unable to clear her left eustachian tube using the Valsalva maneuver and is complaining of decreased hearing which is indicative of eustachian tube dysfunction.  Her blood pressure is also elevated, but as stated above she took Tylenol  severe cold and flu with phenylephrine which most likely caused an increase in her BP.  She reports that she has been taking her Micardis  and has not missed any doses.  I will  discharge her home with a diagnosis of URI with cough and congestion and have cautioned her to stop using the Tylenol  severe cold and flu.  She can use Coricidin HBP as needed.  I will also prescribe doxycycline  100 mg twice daily for 7 days for treatment of her URI along with Atrovent  nasal spray to help with nasal congestion.  Tessalon  Perles and Promethazine  DM cough syrup for cough and congestion.   Final Clinical Impressions(s) / UC Diagnoses   Final diagnoses:  URI with cough and congestion  Acute dysfunction of left eustachian tube     Discharge Instructions      Take the doxycycline  twice daily with food for 7 days for treatment of your upper respiratory infection.  Stop using the Tylenol  severe cold and flu as it contains phenylephrine which is causing increased your blood pressure.  You may use over-the-counter Coricidin HBP as needed for cough and congestion symptoms.  I plan to prescribe you an Atrovent  nasal spray that you can use 2 squirts up each nostril every 6 hours to help with nasal congestion and postnasal drip.  Use over-the-counter Tylenol  and/or ibuprofen or the pack instructions as needed for any fever or pain.  Use the Tessalon  Perles every 8 hours for the day as needed for cough.  Take them with a small sip of water.  They may give you numbness to the base of your tongue or metallic taste in your mouth, this is normal.  Use the Promethazine  DM cough syrup at bedtime needed for cough and congestion.  Be mindful this medication will make you drowsy.  If you develop any new or worsening symptoms please return for reevaluation or follow-up with your primary care provider.     ED Prescriptions     Medication Sig Dispense Auth.  Provider   benzonatate  (TESSALON ) 100 MG capsule Take 2 capsules (200 mg total) by mouth every 8 (eight) hours. 21 capsule Bernardino Ditch, NP   ipratropium (ATROVENT ) 0.06 % nasal spray Place 2 sprays into both nostrils 4 (four) times  daily. 15 mL Bernardino Ditch, NP   doxycycline  (VIBRAMYCIN ) 100 MG capsule Take 1 capsule (100 mg total) by mouth 2 (two) times daily for 7 days. 14 capsule Bernardino Ditch, NP   promethazine -dextromethorphan (PROMETHAZINE -DM) 6.25-15 MG/5ML syrup Take 5 mLs by mouth 4 (four) times daily as needed. 118 mL Bernardino Ditch, NP      PDMP not reviewed this encounter.   Bernardino Ditch, NP 07/06/23 [phone]

## 2023-07-06 NOTE — ED Triage Notes (Signed)
 Pt c/o congestion, sore throat, loss of hearing in right ear, facial pain and jaw pain x9days

## 2023-07-06 NOTE — Discharge Instructions (Addendum)
 Take the doxycycline  twice daily with food for 7 days for treatment of your upper respiratory infection.  Stop using the Tylenol  severe cold and flu as it contains phenylephrine which is causing increased your blood pressure.  You may use over-the-counter Coricidin HBP as needed for cough and congestion symptoms.  I plan to prescribe you an Atrovent  nasal spray that you can use 2 squirts up each nostril every 6 hours to help with nasal congestion and postnasal drip.  Use over-the-counter Tylenol  and/or ibuprofen or the pack instructions as needed for any fever or pain.  Use the Tessalon  Perles every 8 hours for the day as needed for cough.  Take them with a small sip of water.  They may give you numbness to the base of your tongue or metallic taste in your mouth, this is normal.  Use the Promethazine  DM cough syrup at bedtime needed for cough and congestion.  Be mindful this medication will make you drowsy.  If you develop any new or worsening symptoms please return for reevaluation or follow-up with your primary care provider.

## 2024-06-11 ENCOUNTER — Ambulatory Visit: Admission: EM | Admit: 2024-06-11 | Discharge: 2024-06-11 | Disposition: A | Discharge status: Home / Self Care

## 2024-06-11 ENCOUNTER — Encounter: Payer: Self-pay | Admitting: Emergency Medicine

## 2024-06-11 DIAGNOSIS — S8392XA Sprain of unspecified site of left knee, initial encounter: Secondary | ICD-10-CM

## 2024-06-11 HISTORY — DX: Essential (primary) hypertension: I10

## 2024-06-11 HISTORY — DX: Depression, unspecified: F32.A

## 2024-06-11 HISTORY — DX: Anxiety disorder, unspecified: F41.9

## 2024-06-11 MED ORDER — IBUPROFEN 600 MG PO TABS: 600.0000 mg | ORAL_TABLET | Freq: Four times a day (QID) | ORAL | 0 refills | Status: AC | PRN

## 2024-06-11 NOTE — Discharge Instructions (Addendum)
 Take the ibuprofen , 600 mg every 6 hours with food, on a schedule for the next 48 hours and then as needed.  Apply moist ice to your knee for 20 minutes at a time, 2-3 times a day, to help decrease swelling and aid in pain relief.  Make sure you have a cloth between the ice and your skin so as to not cause skin damage.  Follow the knee exercises given at discharge.  If your pain does not improve, or worsens, I would recommend following up with orthopedics such as [facility] here in [place] or in [place].  Both locations have walk-in urgent care hours and you may also make appointments for the urgent care online.

## 2024-06-11 NOTE — ED Provider Notes (Addendum)
 [facility]    CSN: [id number] Arrival date & time: 06/11/24  0804      History   Chief Complaint Chief Complaint  Patient presents with   Knee Pain    HPI [name] is a 43 y.o. female.   HPI  43 year old female with past medical history significant for migraine headaches, hypertension, anxiety, and depression presents for evaluation of 3 days worth of left knee pain.  She reports that she was walking down steps 3 days ago and she twisted her knee laterally.  She reports she has been taking Tylenol  and ibuprofen  without improvement of symptoms.  She has pain with range of motion and with weightbearing.  She describes the pain as a throbbing.  No numbness, tingling, or bruising.  No fall or trauma.  Past Medical History:  Diagnosis Date   Anxiety    Depression    Hypertension    Migraines     There are no active problems to display for this patient.   Past Surgical History:  Procedure Laterality Date   CESAREAN SECTION     FRACTURE SURGERY      OB History   No obstetric history on file.      Home Medications    Prior to Admission medications   Medication Sig Start Date End Date Taking? Authorizing Provider  ferrous sulfate 325 (65 FE) MG tablet Take 325 mg by mouth. 11/10/23 11/09/24 Yes [provider]  ibuprofen  (ADVIL ) 600 MG tablet Take 1 tablet (600 mg total) by mouth every 6 (six) hours as needed. 06/11/24  Yes Bernardino Ditch, NP  metFORMIN (GLUCOPHAGE) 500 MG tablet Take by mouth. 05/24/22 06/11/24 Yes [provider]  olmesartan (BENICAR) 20 MG tablet Take 20 mg by mouth. 06/10/24  Yes [provider]  buPROPion (WELLBUTRIN XL) 150 MG 24 hr tablet Take 150 mg by mouth daily.    [provider]  cetirizine (ZYRTEC) 10 MG tablet Take 10 mg by mouth daily.    [provider]  hydrOXYzine (ATARAX) 25 MG tablet Take 25 mg by mouth 3 (three) times daily. 10/31/21   [provider]  ipratropium  (ATROVENT ) 0.06 % nasal spray Place 2 sprays into both nostrils 4 (four) times daily. 07/06/23   Bernardino Ditch, NP  methocarbamol  (ROBAXIN ) 500 MG tablet Take 1 tablet (500 mg total) by mouth 2 (two) times daily. 04/12/23   Brimage, Vondra, DO  naproxen  (NAPROSYN ) 500 MG tablet Take 1 tablet (500 mg total) by mouth 2 (two) times daily with a meal. 04/12/23   Brimage, Vondra, DO  omeprazole (PRILOSEC) 40 MG capsule Take 40 mg by mouth daily. 11/09/21   [provider]  Prenatal Vit-Fe Fumarate-FA (PRENATAL VITAMIN PO) Take by mouth.    [provider]  SUMAtriptan (IMITREX) 50 MG tablet SMARTSIG:1 Tablet(s) By Mouth 10/31/21   [provider]  telmisartan  (MICARDIS ) 20 MG tablet Take 1 tablet (20 mg total) by mouth daily. 06/03/22 07/03/22  Brimage, Vondra, DO  topiramate (TOPAMAX) 100 MG tablet Take 100 mg by mouth daily. 11/03/21   [provider]  triamcinolone  ointment (KENALOG ) 0.5 % Apply 1 application topically 2 (two) times daily. Prn swelling from insect bites 08/29/18   Rodriguez-Southworth, Kyra, PA-C  venlafaxine XR (EFFEXOR-XR) 150 MG 24 hr capsule Take 150 mg by mouth daily. 11/10/21   [provider]    Family History Family History  Problem Relation Age of Onset   Healthy Mother    Healthy  Father     Social History Social History   Tobacco Use   Smoking status: Former    Current packs/day: 0.50    Types: Cigarettes   Smokeless tobacco: Never  Vaping Use   Vaping status: Former  Substance Use Topics   Alcohol use: No   Drug use: Not Currently     Allergies   Amoxicillin   Review of Systems Review of Systems  Musculoskeletal:  Positive for arthralgias. Negative for joint swelling.  Skin:  Negative for color change.  Neurological:  Negative for weakness and numbness.     Physical Exam Triage Vital Signs ED Triage Vitals  Encounter Vitals Group     BP --      Girls Systolic BP Percentile --      Girls Diastolic BP  Percentile --      Boys Systolic BP Percentile --      Boys Diastolic BP Percentile --      Pulse --      Resp --      Temp --      Temp src --      SpO2 --      Weight 06/11/24 0815 (!) 319 lb (144.7 kg)     Height --      Head Circumference --      Peak Flow --      Pain Score 06/11/24 0817 10     Pain Loc --      Pain Education --      Exclude from Growth Chart --    No data found.  Updated Vital Signs BP (!) 146/86 (BP Location: Right Arm)   Pulse (!) 104   Temp 97.8 F (36.6 C) (Oral)   Resp 17   Wt (!) 319 lb (144.7 kg)   LMP 06/10/2024   SpO2 96%   BMI 54.76 kg/m   Visual Acuity Right Eye Distance:   Left Eye Distance:   Bilateral Distance:    Right Eye Near:   Left Eye Near:    Bilateral Near:     Physical Exam Vitals and nursing note reviewed.  Constitutional:      Appearance: Normal appearance. She is not ill-appearing.  HENT:     Head: Normocephalic and atraumatic.  Musculoskeletal:        General: Tenderness and signs of injury present. No swelling or deformity.  Skin:    General: Skin is warm and dry.     Capillary Refill: Capillary refill takes less than 2 seconds.     Findings: No bruising or erythema.  Neurological:     General: No focal deficit present.     Mental Status: She is alert and oriented to person, place, and time.      UC Treatments / Results  Labs (all labs ordered are listed, but only abnormal results are displayed) Labs Reviewed - No data to display  EKG   Radiology No results found.  Procedures Procedures (including critical care time)  Medications Ordered in UC Medications - No data to display  Initial Impression / Assessment and Plan / UC Course  I have reviewed the triage vital signs and the nursing notes.  Pertinent labs & imaging results that were available during my care of the patient were reviewed by me and considered in my medical decision making (see chart for details).   Patient is a pleasant,  nontoxic-appearing 43 year old female presenting for evaluation of pain to the left knee as outlined in HPI above.  She is describing the pain over the medial aspect of her knee and the anterior medial aspect over the patellar tendon.  On exam, her knee is in normal anatomical alignment.  She has no tenderness with palpation of patella, tibial tuberosity, lateral joint line, or popliteal space.  She does have pain with palpation of the medial joint line and over the medial collateral ligament as well as over the medial portion of the patellar tendon.  DP and PT pulses are 2+.  She does have pain with varus stress testing but not valgus stress testing.  I suspect that she has strained either her patellar tendon, medial collateral ligament, or both.  There may also be a meniscus tear given that she has pain with varus stress testing and out of valgus.  I am going to have staff fit her with a hinged knee brace and continue to use ibuprofen  for pain and inflammation.  Also have her elevate her knee is much as possible to decrease swelling and aid in pain relief.  She may apply ice to her knee for 20 minutes at a time, 2-3 times a day, to with pain and swelling.  I will also provide home physical therapy exercises for her.  I advised her that if her pain does not improve that she should follow-up with orthopedics as she may need advanced imaging to look at the soft tissues of the knee.  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  I told her that I can give her 3 days off of work but that if her pain is not improved, and she is unable to return to work due to pain, that she will need to follow-up with orthopedics sooner as they will be the ones who have to write her for accommodations going forward.   Final Clinical Impressions(s) / UC Diagnoses   Final diagnoses:  Sprain of left knee, unspecified ligament, initial encounter     Discharge Instructions      Take the ibuprofen , 600 mg every  6 hours with food, on a schedule for the next 48 hours and then as needed.  Apply moist ice to your knee for 20 minutes at a time, 2-3 times a day, to help decrease swelling and aid in pain relief.  Make sure you have a cloth between the ice and your skin so as to not cause skin damage.  Follow the knee exercises given at discharge.  If your pain does not improve, or worsens, I would recommend following up with orthopedics such as [facility] here in [place] or in [place].  Both locations have walk-in urgent care hours and you may also make appointments for the urgent care online.     ED Prescriptions     Medication Sig Dispense Auth. Provider   ibuprofen  (ADVIL ) 600 MG tablet Take 1 tablet (600 mg total) by mouth every 6 (six) hours as needed. 30 tablet Bernardino Ditch, NP      PDMP not reviewed this encounter.   Bernardino Ditch, NP 06/11/24 [id number]    Bernardino Ditch, NP 06/11/24 [phone]

## 2024-06-11 NOTE — ED Triage Notes (Signed)
 Pt was walking down some steps 3 days ago and stepped wrong. She twisted her left knee. She had taken tylenol  and ibuprofen  with minimal relief.
